# Patient Record
Sex: Female | Born: 1965 | Race: White | Hispanic: No | Marital: Married | Smoking: Never smoker
Health system: Southern US, Community
[De-identification: ages and names within clinical notes are randomized; demographics above are authoritative.]

## PROBLEM LIST (undated history)

## (undated) DIAGNOSIS — L0390 Cellulitis, unspecified: Secondary | ICD-10-CM

## (undated) DIAGNOSIS — S139XXA Sprain of joints and ligaments of unspecified parts of neck, initial encounter: Secondary | ICD-10-CM

## (undated) DIAGNOSIS — F411 Generalized anxiety disorder: Secondary | ICD-10-CM

## (undated) DIAGNOSIS — R51 Headache: Secondary | ICD-10-CM

## (undated) DIAGNOSIS — J069 Acute upper respiratory infection, unspecified: Secondary | ICD-10-CM

## (undated) DIAGNOSIS — N135 Crossing vessel and stricture of ureter without hydronephrosis: Secondary | ICD-10-CM

## (undated) DIAGNOSIS — R519 Headache, unspecified: Secondary | ICD-10-CM

## (undated) DIAGNOSIS — E669 Obesity, unspecified: Secondary | ICD-10-CM

## (undated) DIAGNOSIS — M25579 Pain in unspecified ankle and joints of unspecified foot: Secondary | ICD-10-CM

## (undated) DIAGNOSIS — I10 Essential (primary) hypertension: Secondary | ICD-10-CM

## (undated) DIAGNOSIS — E785 Hyperlipidemia, unspecified: Secondary | ICD-10-CM

## (undated) DIAGNOSIS — R062 Wheezing: Secondary | ICD-10-CM

## (undated) DIAGNOSIS — N951 Menopausal and female climacteric states: Secondary | ICD-10-CM

## (undated) DIAGNOSIS — F419 Anxiety disorder, unspecified: Secondary | ICD-10-CM

## (undated) DIAGNOSIS — F410 Panic disorder [episodic paroxysmal anxiety] without agoraphobia: Secondary | ICD-10-CM

## (undated) DIAGNOSIS — H35019 Changes in retinal vascular appearance, unspecified eye: Secondary | ICD-10-CM

## (undated) HISTORY — DX: Pain in unspecified ankle and joints of unspecified foot: M25.579

## (undated) HISTORY — DX: Crossing vessel and stricture of ureter without hydronephrosis: N13.5

## (undated) HISTORY — DX: Hyperlipidemia, unspecified: E78.5

## (undated) HISTORY — PX: REDUCTION MAMMAPLASTY: SUR839

## (undated) HISTORY — DX: Obesity, unspecified: E66.9

## (undated) HISTORY — PX: TONSILLECTOMY: SUR1361

## (undated) HISTORY — DX: Generalized anxiety disorder: F41.1

## (undated) HISTORY — DX: Anxiety disorder, unspecified: F41.9

## (undated) HISTORY — DX: Changes in retinal vascular appearance, unspecified eye: H35.019

## (undated) HISTORY — DX: Essential (primary) hypertension: I10

## (undated) HISTORY — DX: Cellulitis, unspecified: L03.90

## (undated) HISTORY — DX: Wheezing: R06.2

## (undated) HISTORY — DX: Headache, unspecified: R51.9

## (undated) HISTORY — DX: Menopausal and female climacteric states: N95.1

## (undated) HISTORY — DX: Sprain of joints and ligaments of unspecified parts of neck, initial encounter: S13.9XXA

## (undated) HISTORY — PX: BREAST ENHANCEMENT SURGERY: SHX7

## (undated) HISTORY — DX: Acute upper respiratory infection, unspecified: J06.9

## (undated) HISTORY — PX: ANKLE SURGERY: SHX546

## (undated) HISTORY — DX: Panic disorder (episodic paroxysmal anxiety): F41.0

## (undated) HISTORY — PX: ABDOMINAL HYSTERECTOMY: SHX81

## (undated) HISTORY — DX: Headache: R51

## (undated) HISTORY — PX: CHOLECYSTECTOMY: SHX55

---

## 2004-03-12 ENCOUNTER — Ambulatory Visit: Payer: Self-pay | Admitting: Unknown Physician Specialty

## 2005-01-17 ENCOUNTER — Emergency Department: Payer: Self-pay | Admitting: Emergency Medicine

## 2007-08-31 ENCOUNTER — Ambulatory Visit: Payer: Self-pay | Admitting: Family Medicine

## 2007-08-31 ENCOUNTER — Other Ambulatory Visit: Payer: Self-pay

## 2007-09-08 ENCOUNTER — Ambulatory Visit: Payer: Self-pay | Admitting: Internal Medicine

## 2008-12-12 ENCOUNTER — Ambulatory Visit: Payer: Self-pay | Admitting: Podiatry

## 2008-12-28 ENCOUNTER — Ambulatory Visit: Payer: Self-pay | Admitting: Podiatry

## 2009-05-25 ENCOUNTER — Emergency Department: Payer: Self-pay | Admitting: Unknown Physician Specialty

## 2009-05-31 ENCOUNTER — Ambulatory Visit: Payer: Self-pay | Admitting: Surgery

## 2010-04-07 ENCOUNTER — Ambulatory Visit: Payer: Self-pay | Admitting: Internal Medicine

## 2010-11-07 ENCOUNTER — Emergency Department: Payer: Self-pay | Admitting: Emergency Medicine

## 2011-01-14 ENCOUNTER — Ambulatory Visit: Payer: Self-pay | Admitting: Urology

## 2011-05-20 ENCOUNTER — Emergency Department: Payer: Self-pay | Admitting: Emergency Medicine

## 2013-02-09 ENCOUNTER — Emergency Department: Payer: Self-pay | Admitting: Emergency Medicine

## 2013-02-09 LAB — COMPREHENSIVE METABOLIC PANEL
Alkaline Phosphatase: 67 U/L
Anion Gap: 6 — ABNORMAL LOW (ref 7–16)
Bilirubin,Total: 0.9 mg/dL (ref 0.2–1.0)
Chloride: 99 mmol/L (ref 98–107)
Co2: 29 mmol/L (ref 21–32)
EGFR (African American): >60
EGFR (Non-African Amer.): >60
Glucose: 105 mg/dL — ABNORMAL HIGH (ref 65–99)
Potassium: 3 mmol/L — ABNORMAL LOW (ref 3.5–5.1)
SGPT (ALT): 45 U/L (ref 12–78)
Sodium: 134 mmol/L — ABNORMAL LOW (ref 136–145)
Total Protein: 8.2 g/dL (ref 6.4–8.2)

## 2013-02-09 LAB — URINALYSIS, COMPLETE
Bacteria: NONE SEEN
Bilirubin,UR: NEGATIVE
Blood: NEGATIVE
Leukocyte Esterase: NEGATIVE
Ph: 5 (ref 4.5–8.0)
Specific Gravity: 1.014 (ref 1.003–1.030)
Squamous Epithelial: 10
WBC UR: 2 /HPF (ref 0–5)

## 2013-02-09 LAB — LIPASE, BLOOD: Lipase: 86 U/L (ref 73–393)

## 2013-02-09 LAB — CBC
HCT: 39.5 % (ref 35.0–47.0)
HGB: 14.1 g/dL (ref 12.0–16.0)
MCH: 30.9 pg (ref 26.0–34.0)
MCV: 87 fL (ref 80–100)
RBC: 4.54 10*6/uL (ref 3.80–5.20)

## 2013-02-09 LAB — TROPONIN I: Troponin-I: <0.02 ng/mL

## 2013-02-17 ENCOUNTER — Encounter: Payer: Self-pay | Admitting: *Deleted

## 2013-02-20 ENCOUNTER — Encounter: Payer: Self-pay | Admitting: Cardiovascular Disease

## 2013-02-20 ENCOUNTER — Ambulatory Visit (INDEPENDENT_AMBULATORY_CARE_PROVIDER_SITE_OTHER): Payer: 59 | Admitting: Cardiovascular Disease

## 2013-02-20 ENCOUNTER — Encounter (INDEPENDENT_AMBULATORY_CARE_PROVIDER_SITE_OTHER): Payer: Self-pay

## 2013-02-20 VITALS — BP 134/100 | HR 94 | Ht 64.0 in | Wt 210.0 lb

## 2013-02-20 DIAGNOSIS — R079 Chest pain, unspecified: Secondary | ICD-10-CM

## 2013-02-20 DIAGNOSIS — I10 Essential (primary) hypertension: Secondary | ICD-10-CM

## 2013-02-20 DIAGNOSIS — R0789 Other chest pain: Secondary | ICD-10-CM | POA: Insufficient documentation

## 2013-02-20 MED ORDER — LISINOPRIL 10 MG PO TABS: 10.0000 mg | ORAL_TABLET | Freq: Every day | ORAL | Status: DC

## 2013-02-20 MED ORDER — POTASSIUM CHLORIDE ER 10 MEQ PO TBCR: 10.0000 meq | EXTENDED_RELEASE_TABLET | Freq: Every day | ORAL | Status: DC

## 2013-02-20 MED ORDER — HYDROCHLOROTHIAZIDE 25 MG PO TABS: 25.0000 mg | ORAL_TABLET | Freq: Every day | ORAL | Status: DC

## 2013-02-20 NOTE — Patient Instructions (Addendum)
  Your physician recommends that you return for lab work in: 3 weeks/BMP  Your physician recommends that you schedule a follow-up appointment in: 3 months/BMP  Your physician has recommended you make the following change in your medication:  Start K Dur Daily  Start lisinopril 10mg  Daily  Decrease HCTZ to 25mg  daily    [facility] Low Glycemic Diet (Source: [facility], 2006) Low Glycemic Foods (20-49) (Decrease risk of developing heart disease) Breakfast Cereals: All-Bran All-Bran Fruit 'n Oats Fiber One Oatmeal (not instant) Oat bran Fruits and fruit juices: (Limit to 1-2 servings per day) Apples Apricots (fresh & dried) Blackberries Blueberries Cherries Cranberries Peaches Pears Plums Prunes Grapefruit Raspberries Strawberries Tangerine Apple juice Grapefruit juice Tomato juice Beans and legumes (fresh-cooked): Black-eyed peas Butter beans Chick peas Lentils  Green beans Lima beans Kidney beans Navy beans Pinto beans Snow peas Non-starchy vegetables: Asparagus, avocado, broccoli, cabbage, cauliflower, celery, cucumber, greens, lettuce, mushrooms, peppers, tomatoes, okra, onions, spinach, summer squash Grains: Barley Bulgur Rye Wild rice Nuts and oils : Almonds Peanuts Sunflower seeds Hazelnuts Pecans Walnuts Oils that are liquid at room temperature Dairy, fish, meat, soy, and eggs: Milk, skim Lowfat cheese Yogurt, lowfat, fruit sugar sweetened Lean red meat Fish  Skinless chicken & [place] Shellfish Egg whites (up to 3 daily) Soy products  Egg yolks (up to 7 or _____ per week) Moderate Glycemic Foods (50-69) Breakfast Cereals: Bran Buds Bran Chex Just Right Mini-Wheats  Special K Swiss muesli Fruits: Banana (under-ripe) Dates Figs Grapes Kiwi Mango Oranges Raisins Fruit Juices: Cranberry juice Orange juice Beans and legumes: Boston-type baked beans Canned pinto, kidney, or navy beans Green peas Vegetables: Beets Carrots   Sweet potato Yam Corn on the cob Breads: Pita (pocket) bread Oat bran bread Pumpernickel bread Rye bread Wheat bread, high fiber  Grains: Cornmeal Rice, brown Rice, white Couscous Pasta: Macaroni Pizza, cheese Ravioli, meat filled Spaghetti, white  Nuts: Cashews Macadamia Snacks: Chocolate Ice cream, lowfat Muffin Popcorn High Glycemic Foods (70-100)  Breakfast Cereals: Cheerios Corn Chex Corn Flakes Cream of Wheat Grape Nuts Grape Nut Flakes Grits Nutri-Grain Puffed Rice Puffed Wheat Rice Chex Rice Krispies Shredded Wheat Team Total Fruits: Pineapple Watermelon Banana (over-ripe) Beverages: Sodas, sweet tea, pineapple juice Vegetables: Potato, baked, boiled, fried, mashed [place] fries Canned or frozen corn Parsnips Winter squash Breads: Most breads (white and whole grain) Bagels Bread sticks Bread stuffing Kaiser roll Dinner rolls Grains: Rice, instant Tapioca, with milk Candy and most cookies Snacks: Donuts Corn chips Jelly beans Pretzels Pastries    REDUCE HIGH SODIUM FOODS LIKE CANNED SOUP, GRAVY, SAUCES, READY PREPARED FOODS LIKE FROZEN FOODS; LEAN CUISINE, LASAGNA. BACON, SAUSAGE, LUNCH MEAT, FAST FOODS, HOT DOGS, CHIPS, PIZZA.

## 2013-02-20 NOTE — Assessment & Plan Note (Signed)
Her blood pressure is moderately elevated. She's on hydrochlorothiazide 50 mg a day. She admits to having some palpitations which I suspect are due to premature ventricular contractions.  Her potassium level recently when she was seen in the [facility] emergency room was 3.0.  We will reduce her HCTZ to 25 mg a day. We'll add potassium chloride 10 mg a day. We'll also add lisinopril 10 mg a day. We'll see her back in the office in 3 weeks for this about profile. We'll see her back in 3 months or followup office visit as well as basic metabolic profile.

## 2013-02-20 NOTE — Assessment & Plan Note (Signed)
Meagan Becker presents for further evaluation and management of her chest discomfort. Her episodes of chest discomfort sound very atypical and I do not think that she has obstructive coronary artery disease.  Her pains were more related to a GI etiology.  We will start her on her regular exercise program. If she is able to progress and increase her exercise regimen on a weekly basis without difficulties, then I do not think that she has any serious coronary artery disease. I think that she is somewhat deconditioned.  She clearly eats a lot of unhealthy foods. She admits to eating a lot of fast foods and prepared foods. I suspect that she may have some degree of gastroesophageal reflux. We spent some time going over a healthy diet. Given her the [facility] low glycemic index diet.

## 2013-02-20 NOTE — Progress Notes (Signed)
     Meagan Becker Date of Birth  1965/06/03       [facility]    [place] [address], [address]  [address], [address] [place], [place]  [address]   [place], [place]  [address] [phone]     [phone]   Fax  [phone]    Fax [phone]  Problem List: 1. Chest pain / abdominal pain 2. Palpitations 3. Hypertension 4. Anxiety   History of Present Illness:  Meagan Becker is a 47 yo with hx of anxiety and HTN.  She presented to [facility] with chest pain / abdominal pain / abdominal swelling.    She was treated with GI cocktail and improved.  She has had some palpitations since that time.  She does not exercise - she is able to do her normal activities without problems.     Current Outpatient Prescriptions on File Prior to Visit  Medication Sig Dispense Refill  . hydrochlorothiazide (HYDRODIURIL) 50 MG tablet Take 50 mg by mouth daily.       No current facility-administered medications on file prior to visit.    No Known Allergies  Past Medical History  Diagnosis Date  . Anxiety state   . Hypertension   . Cellulitis   . Anxiety disorder   . Headache   . Hyperlipidemia   . Pain, joint, ankle   . Obesity   . Panic disorder   . Retinal vascular changes   . Sprain of neck   . Stricture of ureter   . Menopausal symptom   . Upper respiratory infection   . Wheezing   . Cancer of uterus     Past Surgical History  Procedure Laterality Date  . Abdominal hysterectomy    . Tonsillectomy    . Breast enhancement surgery    . Cholecystectomy    . Ankle surgery      History  Smoking status  . Never Smoker   Smokeless tobacco  . Not on file    History  Alcohol Use  . Yes    Comment: social    Family History  Problem Relation Age of Onset  . Hypercholesterolemia Mother   . Hypertension Mother   . Heart disease Father   . Hypertension Father   . Cancer Father     Reviw of Systems:  Reviewed in the HPI.  All other systems are  negative.  Physical Exam: Blood pressure 134/100, pulse 94, height 5\' 4"  (1.626 m), weight 210 lb (95.255 kg). General: Well developed, well nourished, in no acute distress.  Head: Normocephalic, atraumatic, sclera non-icteric, mucus membranes are moist,   Neck: Supple. Carotids are 2 + without bruits. No JVD   Lungs: Clear   Heart: RR, normal S1, S2  Abdomen: Soft, non-tender, non-distended with normal bowel sounds.   Msk:  Strength and tone are normal   Extremities: No clubbing or cyanosis. No edema.  Distal pedal pulses are 2+ and equal    Neuro: CN II - XII intact.  Alert and oriented X 3.   Psych:  Normal   ECG: Dec. 8 , 2014   NSR , no St or T wave changes , rsR'  Assessment / Plan:

## 2013-03-13 ENCOUNTER — Other Ambulatory Visit: Payer: 59

## 2013-03-15 ENCOUNTER — Ambulatory Visit: Payer: Self-pay | Admitting: Physician Assistant

## 2013-03-20 ENCOUNTER — Other Ambulatory Visit: Payer: 59

## 2013-05-15 ENCOUNTER — Other Ambulatory Visit: Payer: 59

## 2013-05-15 ENCOUNTER — Ambulatory Visit: Payer: 59 | Admitting: Cardiovascular Disease

## 2013-05-15 ENCOUNTER — Encounter: Payer: Self-pay | Admitting: *Deleted

## 2014-02-12 ENCOUNTER — Other Ambulatory Visit: Payer: Self-pay

## 2014-02-12 DIAGNOSIS — R079 Chest pain, unspecified: Secondary | ICD-10-CM

## 2014-02-12 NOTE — Telephone Encounter (Signed)
Refill HCTZ

## 2014-05-16 ENCOUNTER — Ambulatory Visit: Payer: Self-pay | Admitting: Family Medicine

## 2014-05-23 ENCOUNTER — Telehealth: Payer: Self-pay | Admitting: Cardiovascular Disease

## 2014-05-23 NOTE — Telephone Encounter (Signed)
[facility] called to obtain records for a new patient appt. Patient was a no show.  Office will call back later for records if patient reschedules and signs a release form.

## 2014-07-30 ENCOUNTER — Ambulatory Visit
Admission: RE | Admit: 2014-07-30 | Discharge: 2014-07-30 | Disposition: A | Discharge status: Home / Self Care | Payer: Managed Care, Other (non HMO) | Source: Ambulatory Visit | Attending: Family Medicine | Admitting: Family Medicine

## 2014-07-30 ENCOUNTER — Other Ambulatory Visit: Payer: Self-pay | Admitting: Family Medicine

## 2014-07-30 DIAGNOSIS — M792 Neuralgia and neuritis, unspecified: Secondary | ICD-10-CM | POA: Diagnosis present

## 2014-11-08 ENCOUNTER — Emergency Department: Payer: Managed Care, Other (non HMO)

## 2014-11-08 ENCOUNTER — Encounter: Payer: Self-pay | Admitting: Emergency Medicine

## 2014-11-08 ENCOUNTER — Emergency Department
Admission: EM | Admit: 2014-11-08 | Discharge: 2014-11-08 | Disposition: A | Discharge status: Home / Self Care | Payer: Managed Care, Other (non HMO) | Attending: Emergency Medicine | Admitting: Emergency Medicine

## 2014-11-08 DIAGNOSIS — M25511 Pain in right shoulder: Secondary | ICD-10-CM | POA: Diagnosis present

## 2014-11-08 DIAGNOSIS — I10 Essential (primary) hypertension: Secondary | ICD-10-CM | POA: Insufficient documentation

## 2014-11-08 DIAGNOSIS — Z79899 Other long term (current) drug therapy: Secondary | ICD-10-CM | POA: Insufficient documentation

## 2014-11-08 MED ORDER — PREDNISONE 10 MG PO TABS: ORAL_TABLET | ORAL | Status: DC

## 2014-11-08 NOTE — ED Notes (Signed)
Pt states her right shoulder began hurting on Monday, denies injury to the area, pt states her neck/shoulder area are stiff.

## 2014-11-08 NOTE — ED Provider Notes (Signed)
[facility] Emergency Department Provider Note  ____________________________________________  Time seen: Approximately 8:35 AM  I have reviewed the triage vital signs and the nursing notes.   HISTORY  Chief Complaint Shoulder Pain   HPI Meagan Becker is a 49 y.o. female , complains of right shoulder pain4 days. Patient states that there is a burning sensation in her right upper arm. She states also makes her shoulder and neck muscle stiff. She saw her doctor this week who prescribed Klonopin, Robaxin, and Norco which is not helping. Patient denies any history of injury that she is aware of. She states that the only thing that she lifts is  a baby. Currently her pain is 9 out of 10. Any range of motion increases her pain and nothing is helping her pain. She also states that yesterday she began throwing up after beginning her medication. She has taken Norco in the past and not had any problems. She is unsure whether this was the Robaxin or the Klonopin that was doing this.   Past Medical History  Diagnosis Date  . Anxiety state   . Hypertension   . Cellulitis   . Anxiety disorder   . Headache   . Hyperlipidemia   . Pain, joint, ankle   . Obesity   . Panic disorder   . Retinal vascular changes   . Sprain of neck   . Stricture of ureter   . Menopausal symptom   . Upper respiratory infection   . Wheezing   . Cancer of uterus     Patient Active Problem List   Diagnosis Date Noted  . Essential hypertension 02/20/2013  . Chest discomfort 02/20/2013    Past Surgical History  Procedure Laterality Date  . Abdominal hysterectomy    . Tonsillectomy    . Breast enhancement surgery    . Cholecystectomy    . Ankle surgery      Current Outpatient Rx  Name  Route  Sig  Dispense  Refill  . diazepam (VALIUM) 5 MG tablet   Oral   Take 5 mg by mouth as needed.          . hydrochlorothiazide (HYDRODIURIL) 25 MG tablet   Oral   Take 1 tablet (25 mg  total) by mouth daily.   90 tablet   3   . lisinopril (PRINIVIL,ZESTRIL) 10 MG tablet   Oral   Take 1 tablet (10 mg total) by mouth daily.   90 tablet   3   . potassium chloride (K-DUR) 10 MEQ tablet   Oral   Take 1 tablet (10 mEq total) by mouth daily.   90 tablet   3   . predniSONE (DELTASONE) 10 MG tablet      Take 6 tablets  today, on day 2 take 5 tablets, day 3 take 4 tablets, day 4 take 3 tablets, day 5 take  2 tablets and 1 tablet the last day   21 tablet   0     Allergies Review of patient's allergies indicates no known allergies.  Family History  Problem Relation Age of Onset  . Hypercholesterolemia Mother   . Hypertension Mother   . Heart disease Father   . Hypertension Father   . Cancer Father     Social History Social History  Substance Use Topics  . Smoking status: Never Smoker   . Smokeless tobacco: None  . Alcohol Use: Yes     Comment: social    Review of Systems Constitutional:  No fever/chills Cardiovascular: Denies chest pain. Respiratory: Denies shortness of breath. Genitourinary: Negative for dysuria. Musculoskeletal: Negative for back pain. Positive for right arm pain Skin: Negative for rash. Neurological: Negative for headaches, focal weakness or numbness.  10-point ROS otherwise negative.  ____________________________________________   PHYSICAL EXAM:  VITAL SIGNS: ED Triage Vitals  Enc Vitals Group     BP 11/08/14 0819 119/73 mmHg     Pulse Rate 11/08/14 0819 89     Resp 11/08/14 0819 18     Temp 11/08/14 0819 98.8 F (37.1 C)     Temp Source 11/08/14 0819 Oral     SpO2 11/08/14 0819 100 %     Weight 11/08/14 0814 200 lb (90.719 kg)     Height 11/08/14 0814 5\' 4"  (1.626 m)     Head Cir --      Peak Flow --      Pain Score 11/08/14 0814 9     Pain Loc --      Pain Edu? --      Excl. in [place]? --     Constitutional: Alert and oriented. Well appearing and in no acute distress. Eyes: Conjunctivae are normal. PERRL.  EOMI. Head: Atraumatic. Nose: No congestion/rhinnorhea. Neck: No stridor.  Supple. No restriction in range of motion. There is some right trapezius muscle tenderness. No gross deformity. Cardiovascular: Normal rate, regular rhythm. Grossly normal heart sounds.  Good peripheral circulation. Respiratory: Normal respiratory effort.  No retractions. Lungs CTAB. Gastrointestinal: No distention. Musculoskeletal: No lower extremity tenderness nor edema.  No joint effusions. Neurologic:  Normal speech and language. No gross focal neurologic deficits are appreciated. No gait instability. Skin:  Skin is warm, dry and intact. No rash noted. Psychiatric: Mood and affect are normal. Speech and behavior are normal.  ____________________________________________   LABS (all labs ordered are listed, but only abnormal results are displayed)  Labs Reviewed - No data to display   RADIOLOGY  Right shoulder per radiologist normal I, Johnn Hai, personally viewed and evaluated these images (plain radiographs) as part of my medical decision making.  ____________________________________________   PROCEDURES  Procedure(s) performed: None  Critical Care performed: No  ____________________________________________   INITIAL IMPRESSION / ASSESSMENT AND PLAN / ED COURSE  Pertinent labs & imaging results that were available during my care of the patient were reviewed by me and considered in my medical decision making (see chart for details).  Patient was placed in a sling for comfort. She is going to discontinue the Robaxin as she thinks this is what is causing her nausea. She is given a prescription for prednisone 10 mg 6 day taper. She was reassured that the bone of her shoulder was normal. We discussed an MRI either through her PCP or orthopedist. Most likely this is a rotator cuff injury. Patient will use ice and stay out of heat to see if this gives her more relief also.    FINAL CLINICAL  IMPRESSION(S) / ED DIAGNOSES  Final diagnoses:  Acute shoulder pain, right      Johnn Hai, PA-C 11/08/14 1350  Lavonia Drafts, MD 11/08/14 [phone]

## 2014-11-20 ENCOUNTER — Other Ambulatory Visit: Payer: Self-pay | Admitting: Orthopedic Surgery

## 2014-11-20 DIAGNOSIS — M5412 Radiculopathy, cervical region: Secondary | ICD-10-CM

## 2014-11-28 ENCOUNTER — Ambulatory Visit
Admission: RE | Admit: 2014-11-28 | Discharge: 2014-11-28 | Disposition: A | Discharge status: Home / Self Care | Payer: Managed Care, Other (non HMO) | Source: Ambulatory Visit | Attending: Orthopedic Surgery | Admitting: Orthopedic Surgery

## 2014-11-28 DIAGNOSIS — M4722 Other spondylosis with radiculopathy, cervical region: Secondary | ICD-10-CM | POA: Insufficient documentation

## 2014-11-28 DIAGNOSIS — M5412 Radiculopathy, cervical region: Secondary | ICD-10-CM

## 2014-11-28 DIAGNOSIS — E041 Nontoxic single thyroid nodule: Secondary | ICD-10-CM | POA: Diagnosis not present

## 2014-11-30 ENCOUNTER — Other Ambulatory Visit: Payer: Self-pay | Admitting: Orthopedic Surgery

## 2014-11-30 DIAGNOSIS — G9589 Other specified diseases of spinal cord: Secondary | ICD-10-CM

## 2014-11-30 DIAGNOSIS — M5412 Radiculopathy, cervical region: Secondary | ICD-10-CM

## 2014-12-04 ENCOUNTER — Ambulatory Visit: Payer: Managed Care, Other (non HMO)

## 2014-12-07 ENCOUNTER — Ambulatory Visit
Admission: RE | Admit: 2014-12-07 | Discharge: 2014-12-07 | Disposition: A | Discharge status: Home / Self Care | Payer: Managed Care, Other (non HMO) | Source: Ambulatory Visit | Attending: Orthopedic Surgery | Admitting: Orthopedic Surgery

## 2014-12-07 DIAGNOSIS — G959 Disease of spinal cord, unspecified: Secondary | ICD-10-CM | POA: Diagnosis not present

## 2014-12-07 DIAGNOSIS — M5412 Radiculopathy, cervical region: Secondary | ICD-10-CM

## 2014-12-07 DIAGNOSIS — G9589 Other specified diseases of spinal cord: Secondary | ICD-10-CM

## 2014-12-07 LAB — POCT I-STAT CREATININE: CREATININE: 0.9 mg/dL (ref 0.44–1.00)

## 2014-12-07 MED ORDER — GADOBENATE DIMEGLUMINE 529 MG/ML IV SOLN
20.0000 mL | Freq: Once | INTRAVENOUS | Status: AC | PRN
  Administered 2014-12-07: 20 mL via INTRAVENOUS

## 2015-02-04 ENCOUNTER — Other Ambulatory Visit: Payer: Self-pay | Admitting: Otolaryngology

## 2015-02-04 DIAGNOSIS — E041 Nontoxic single thyroid nodule: Secondary | ICD-10-CM

## 2015-02-12 ENCOUNTER — Ambulatory Visit
Admission: RE | Admit: 2015-02-12 | Discharge: 2015-02-12 | Disposition: A | Discharge status: Home / Self Care | Payer: Managed Care, Other (non HMO) | Source: Ambulatory Visit | Attending: Otolaryngology | Admitting: Otolaryngology

## 2015-02-12 DIAGNOSIS — E041 Nontoxic single thyroid nodule: Secondary | ICD-10-CM | POA: Diagnosis present

## 2015-02-12 NOTE — Procedures (Signed)
Procedure and risks discussed with patient. Informed consent obtained. Under US guidance, FNA of left thyroid nodule was performed.No immediate complications.

## 2015-02-13 LAB — CYTOLOGY - NON PAP

## 2015-03-26 ENCOUNTER — Other Ambulatory Visit: Payer: Self-pay | Admitting: Neurosurgery

## 2015-03-26 DIAGNOSIS — D497 Neoplasm of unspecified behavior of endocrine glands and other parts of nervous system: Secondary | ICD-10-CM

## 2015-03-26 DIAGNOSIS — G35 Multiple sclerosis: Secondary | ICD-10-CM

## 2015-04-04 ENCOUNTER — Other Ambulatory Visit: Payer: Managed Care, Other (non HMO)

## 2015-04-04 ENCOUNTER — Inpatient Hospital Stay: Admission: RE | Admit: 2015-04-04 | Payer: Managed Care, Other (non HMO) | Source: Ambulatory Visit

## 2015-10-13 ENCOUNTER — Emergency Department
Admission: EM | Admit: 2015-10-13 | Discharge: 2015-10-13 | Disposition: A | Discharge status: Home / Self Care | Payer: 59 | Attending: Emergency Medicine | Admitting: Emergency Medicine

## 2015-10-13 ENCOUNTER — Emergency Department: Payer: 59

## 2015-10-13 ENCOUNTER — Encounter: Payer: Self-pay | Admitting: Emergency Medicine

## 2015-10-13 DIAGNOSIS — Y929 Unspecified place or not applicable: Secondary | ICD-10-CM | POA: Diagnosis not present

## 2015-10-13 DIAGNOSIS — X501XXA Overexertion from prolonged static or awkward postures, initial encounter: Secondary | ICD-10-CM | POA: Diagnosis not present

## 2015-10-13 DIAGNOSIS — Z8541 Personal history of malignant neoplasm of cervix uteri: Secondary | ICD-10-CM | POA: Diagnosis not present

## 2015-10-13 DIAGNOSIS — W19XXXA Unspecified fall, initial encounter: Secondary | ICD-10-CM

## 2015-10-13 DIAGNOSIS — E785 Hyperlipidemia, unspecified: Secondary | ICD-10-CM | POA: Insufficient documentation

## 2015-10-13 DIAGNOSIS — S80211A Abrasion, right knee, initial encounter: Secondary | ICD-10-CM

## 2015-10-13 DIAGNOSIS — S8391XA Sprain of unspecified site of right knee, initial encounter: Secondary | ICD-10-CM | POA: Insufficient documentation

## 2015-10-13 DIAGNOSIS — I10 Essential (primary) hypertension: Secondary | ICD-10-CM | POA: Diagnosis not present

## 2015-10-13 DIAGNOSIS — Y999 Unspecified external cause status: Secondary | ICD-10-CM | POA: Insufficient documentation

## 2015-10-13 DIAGNOSIS — Y939 Activity, unspecified: Secondary | ICD-10-CM | POA: Diagnosis not present

## 2015-10-13 DIAGNOSIS — M25561 Pain in right knee: Secondary | ICD-10-CM | POA: Diagnosis present

## 2015-10-13 MED ORDER — NAPROXEN 500 MG PO TABS
500.0000 mg | ORAL_TABLET | Freq: Two times a day (BID) | ORAL | 0 refills | Status: DC
Start: 2015-10-13 — End: 2015-12-22

## 2015-10-13 NOTE — ED Provider Notes (Signed)
[facility] Emergency Department Provider Note  ____________________________________________  Time seen: Approximately 2:57 PM  I have reviewed the triage vital signs and the nursing notes.   HISTORY  Chief Complaint Knee Pain    HPI Meagan Becker is a 50 y.o. female , NAD, presents to the emergency department for evaluation of right lower extremity pain after a fall. Patient states she has chronic weakness of the left lower extremity due to previous injuries and surgery. States she felt the left lower extremity "giving out" and tried to brace her self with a right lower extremity causing her to twist at the knee and fall. States she has had pain from the right hip down through the right ankle since the injury. Fell onto her right side including her upper extremity but has no pain about the right upper extremity. Notes swelling in the right knee but no redness or warmth. Denies any head injury, visual changes, LOC, saddle paresthesias nor loss of bowel or bladder control. Has been icing the right knee which has given some relief. Has not taken anything over-the-counter.   Past Medical History:  Diagnosis Date  . Anxiety disorder   . Anxiety state   . Cancer of uterus ([place])   . Cellulitis   . Headache   . Hyperlipidemia   . Hypertension   . Menopausal symptom   . Obesity   . Pain, joint, ankle   . Panic disorder   . Retinal vascular changes   . Sprain of neck   . Stricture of ureter   . Upper respiratory infection   . Wheezing     Patient Active Problem List   Diagnosis Date Noted  . Essential hypertension 02/20/2013  . Chest discomfort 02/20/2013    Past Surgical History:  Procedure Laterality Date  . ABDOMINAL HYSTERECTOMY    . ANKLE SURGERY    . BREAST ENHANCEMENT SURGERY    . CHOLECYSTECTOMY    . TONSILLECTOMY      Prior to Admission medications   Medication Sig Start Date End Date Taking? Authorizing Provider  diazepam (VALIUM) 5  MG tablet Take 5 mg by mouth as needed.  02/18/13   Historical Provider, MD  hydrochlorothiazide (HYDRODIURIL) 25 MG tablet Take 1 tablet (25 mg total) by mouth daily. 02/20/13   Thayer Headings, MD  lisinopril (PRINIVIL,ZESTRIL) 10 MG tablet Take 1 tablet (10 mg total) by mouth daily. 02/20/13   Thayer Headings, MD  naproxen (NAPROSYN) 500 MG tablet Take 1 tablet (500 mg total) by mouth 2 (two) times daily with a meal. 10/13/15   Jami L Hagler, PA-C  potassium chloride (K-DUR) 10 MEQ tablet Take 1 tablet (10 mEq total) by mouth daily. 02/20/13   Thayer Headings, MD  predniSONE (DELTASONE) 10 MG tablet Take 6 tablets  today, on day 2 take 5 tablets, day 3 take 4 tablets, day 4 take 3 tablets, day 5 take  2 tablets and 1 tablet the last day 11/08/14   Johnn Hai, PA-C    Allergies Review of patient's allergies indicates no known allergies.  Family History  Problem Relation Age of Onset  . Hypercholesterolemia Mother   . Hypertension Mother   . Heart disease Father   . Hypertension Father   . Cancer Father     Social History Social History  Substance Use Topics  . Smoking status: Never Smoker  . Smokeless tobacco: Never Used  . Alcohol use Yes     Comment: social  Review of Systems  Constitutional: No fever/chills, fatigue Eyes: No visual changes. Cardiovascular: No chest pain. Respiratory:  No shortness of breath. No wheezing.  Gastrointestinal: No abdominal pain.  No nausea, vomiting.   Musculoskeletal: Positive pain diffusely about the right lower extremity starting at the hip and ending at the ankle. Negative for back, neck pain.  Skin: Positive abrasion right knee. Negative for rash, redness, swelling, bruising, open wounds or lacerations. Neurological: Negative for headaches, focal weakness or numbness. No tingling. 10-point ROS otherwise negative.  ____________________________________________   PHYSICAL EXAM:  VITAL SIGNS: ED Triage Vitals  Enc Vitals Group      BP 10/13/15 1406 (!) 145/85     Pulse Rate 10/13/15 1406 (!) 103     Resp 10/13/15 1406 18     Temp 10/13/15 1406 98.4 F (36.9 C)     Temp Source 10/13/15 1406 Oral     SpO2 10/13/15 1406 97 %     Weight 10/13/15 1406 210 lb (95.3 kg)     Height 10/13/15 1406 5\' 4"  (1.626 m)     Head Circumference --      Peak Flow --      Pain Score 10/13/15 1401 7     Pain Loc --      Pain Edu? --      Excl. in [place]? --      Constitutional: Alert and oriented. Well appearing and in no acute distress. Eyes: Conjunctivae are normal.  Head: Atraumatic. Cardiovascular: Good peripheral circulation with 2+ pulses noted in the right lower extremity. Capillary refill is brisk in all digits of the right foot. Respiratory: Normal respiratory effort without tachypnea or retractions.  Musculoskeletal: Full range of motion of the right hip, knee, ankle and foot but with pain with full extension of the right knee and flexion of the right hip. No laxity with anterior or posterior drawer of the right knee. No laxity with varus or valgus stress of the left knee. Negative patellofemoral grind of the right knee. No tenderness to palpation about the femur nor right lower extremity. No laxity with anterior or posterior drawer of the right ankle. No laxity with varus or valgus stress of the right ankle. Full range of motion of the right ankle without crepitus or bony abnormalities noted. No lower extremity tenderness nor edema.  No joint effusions. Neurologic:  Normal speech and language. No gross focal neurologic deficits are appreciated.  Skin:  Superficial, nonbleeding abrasion noted to the anterior portion of the right knee. Skin is warm, dry. No rash, redness, bruising, skin sores, open wounds or lacerations are noted. Psychiatric: Mood and affect are normal. Speech and behavior are normal. Patient exhibits appropriate insight and judgement.   ____________________________________________    LABS  None ____________________________________________  EKG  None ____________________________________________  RADIOLOGY I have personally viewed and evaluated these images (plain radiographs) as part of my medical decision making, as well as reviewing the written report by the radiologist.  Dg Knee Complete 4 Views Right  Result Date: 10/13/2015 CLINICAL DATA:  Right knee pain status post fall. EXAM: RIGHT KNEE - COMPLETE 4+ VIEW COMPARISON:  None. FINDINGS: No evidence of fracture, dislocation, or joint effusion. No evidence of arthropathy or other focal bone abnormality. Soft tissues are unremarkable. IMPRESSION: Negative. Electronically Signed   By: Fidela Salisbury M.D.   On: 10/13/2015 14:39  Dg Hip Unilat With Pelvis 2-3 Views Right  Result Date: 10/13/2015 CLINICAL DATA:  Fall, right groin pain. EXAM: DG HIP (WITH  OR WITHOUT PELVIS) 2-3V RIGHT COMPARISON:  None FINDINGS: Hip joints and SI joints are symmetric and unremarkable. No acute bony abnormality. Specifically, no fracture, subluxation, or dislocation. Soft tissues are intact. IMPRESSION: No acute bony abnormality. Electronically Signed   By: Rolm Baptise M.D.   On: 10/13/2015 15:26   ____________________________________________    PROCEDURES  Procedure(s) performed: None   Procedures   Medications - No data to display   ____________________________________________   INITIAL IMPRESSION / ASSESSMENT AND PLAN / ED COURSE  Pertinent labs & imaging results that were available during my care of the patient were reviewed by me and considered in my medical decision making (see chart for details).  Clinical Course    Patient's diagnosis is consistent with Right knee sprain, abrasion of right knee due to fall. Patient will be discharged home with prescriptions for naproxen to take as directed. Patient may apply ice to the affected areas 20 minutes 3-4 times daily as needed. Discussed by range of motion and  stretching to decrease pain and improve mobility. Patient is to follow up with her primary care provider if symptoms persist past this treatment course. Patient is given ED precautions to return to the ED for any worsening or new symptoms.      ____________________________________________  FINAL CLINICAL IMPRESSION(S) / ED DIAGNOSES  Final diagnoses:  Right knee sprain, initial encounter  Abrasion of right knee, initial encounter  Fall, initial encounter      NEW MEDICATIONS STARTED DURING THIS VISIT:  Discharge Medication List as of 10/13/2015  3:43 PM    START taking these medications   Details  naproxen (NAPROSYN) 500 MG tablet Take 1 tablet (500 mg total) by mouth 2 (two) times daily with a meal., Starting Sun 10/13/2015, [place], PA-C 10/13/15 1625    Harvest Dark, MD 10/14/15 1511

## 2015-10-13 NOTE — ED Notes (Signed)
States she twisted left ankle  Then twisted the right and fell onto right side   Having some pain from right hip area to ankle  states pain is mainly to right knee   No deformity noted

## 2015-10-13 NOTE — ED Triage Notes (Signed)
Pt states fell approx 2 hrs ago. Pt c/o R leg pain, specifically R knee at this time. Denies LOC, denies hitting her head.

## 2015-12-22 ENCOUNTER — Encounter: Payer: Self-pay | Admitting: Emergency Medicine

## 2015-12-22 ENCOUNTER — Emergency Department
Admission: EM | Admit: 2015-12-22 | Discharge: 2015-12-22 | Disposition: A | Discharge status: Home / Self Care | Payer: Commercial Managed Care - HMO | Attending: Emergency Medicine | Admitting: Emergency Medicine

## 2015-12-22 ENCOUNTER — Other Ambulatory Visit: Payer: Self-pay

## 2015-12-22 ENCOUNTER — Emergency Department: Payer: Commercial Managed Care - HMO

## 2015-12-22 DIAGNOSIS — I10 Essential (primary) hypertension: Secondary | ICD-10-CM | POA: Insufficient documentation

## 2015-12-22 DIAGNOSIS — N3001 Acute cystitis with hematuria: Secondary | ICD-10-CM | POA: Diagnosis not present

## 2015-12-22 DIAGNOSIS — Z8542 Personal history of malignant neoplasm of other parts of uterus: Secondary | ICD-10-CM | POA: Insufficient documentation

## 2015-12-22 DIAGNOSIS — R1012 Left upper quadrant pain: Secondary | ICD-10-CM | POA: Diagnosis present

## 2015-12-22 DIAGNOSIS — Z79899 Other long term (current) drug therapy: Secondary | ICD-10-CM | POA: Insufficient documentation

## 2015-12-22 DIAGNOSIS — R1084 Generalized abdominal pain: Secondary | ICD-10-CM

## 2015-12-22 LAB — CBC
HCT: 36.6 % (ref 35.0–47.0)
Hemoglobin: 12.8 g/dL (ref 12.0–16.0)
MCH: 29.9 pg (ref 26.0–34.0)
MCHC: 34.9 g/dL (ref 32.0–36.0)
MCV: 85.5 fL (ref 80.0–100.0)
PLATELETS: 349 10*3/uL (ref 150–440)
RBC: 4.28 MIL/uL (ref 3.80–5.20)
RDW: 12.3 % (ref 11.5–14.5)
WBC: 6.4 10*3/uL (ref 3.6–11.0)

## 2015-12-22 LAB — URINALYSIS COMPLETE WITH MICROSCOPIC (ARMC ONLY)
BILIRUBIN URINE: NEGATIVE
Glucose, UA: NEGATIVE mg/dL
HGB URINE DIPSTICK: NEGATIVE
KETONES UR: NEGATIVE mg/dL
Nitrite: NEGATIVE
PH: 5 (ref 5.0–8.0)
Protein, ur: NEGATIVE mg/dL
Specific Gravity, Urine: 1.019 (ref 1.005–1.030)

## 2015-12-22 LAB — COMPREHENSIVE METABOLIC PANEL
ALT: 29 U/L (ref 14–54)
AST: 32 U/L (ref 15–41)
Albumin: 4 g/dL (ref 3.5–5.0)
Alkaline Phosphatase: 43 U/L (ref 38–126)
Anion gap: 7 (ref 5–15)
BUN: 14 mg/dL (ref 6–20)
CHLORIDE: 102 mmol/L (ref 101–111)
CO2: 27 mmol/L (ref 22–32)
CREATININE: 0.78 mg/dL (ref 0.44–1.00)
Calcium: 9.1 mg/dL (ref 8.9–10.3)
Glucose, Bld: 103 mg/dL — ABNORMAL HIGH (ref 65–99)
POTASSIUM: 3.6 mmol/L (ref 3.5–5.1)
Sodium: 136 mmol/L (ref 135–145)
Total Bilirubin: 1.1 mg/dL (ref 0.3–1.2)
Total Protein: 7.6 g/dL (ref 6.5–8.1)

## 2015-12-22 LAB — PREGNANCY, URINE: Preg Test, Ur: NEGATIVE

## 2015-12-22 LAB — LIPASE, BLOOD: LIPASE: 21 U/L (ref 11–51)

## 2015-12-22 LAB — TROPONIN I

## 2015-12-22 MED ORDER — MORPHINE SULFATE (PF) 4 MG/ML IV SOLN
INTRAVENOUS | Status: AC
  Administered 2015-12-22: 4 mg via INTRAVENOUS
  Filled 2015-12-22: qty 1

## 2015-12-22 MED ORDER — CEPHALEXIN 500 MG PO CAPS: 500.0000 mg | ORAL_CAPSULE | Freq: Four times a day (QID) | ORAL | 0 refills | Status: AC

## 2015-12-22 MED ORDER — ONDANSETRON HCL 4 MG/2ML IJ SOLN
4.0000 mg | Freq: Once | INTRAMUSCULAR | Status: AC
  Administered 2015-12-22: 4 mg via INTRAVENOUS

## 2015-12-22 MED ORDER — ONDANSETRON HCL 4 MG/2ML IJ SOLN
INTRAMUSCULAR | Status: AC
  Administered 2015-12-22: 4 mg via INTRAVENOUS
  Filled 2015-12-22: qty 2

## 2015-12-22 MED ORDER — IOPAMIDOL (ISOVUE-300) INJECTION 61%
30.0000 mL | Freq: Once | INTRAVENOUS | Status: AC
  Administered 2015-12-22: 30 mL via ORAL

## 2015-12-22 MED ORDER — IOPAMIDOL (ISOVUE-300) INJECTION 61%
100.0000 mL | Freq: Once | INTRAVENOUS | Status: AC | PRN
  Administered 2015-12-22: 100 mL via INTRAVENOUS

## 2015-12-22 MED ORDER — MORPHINE SULFATE (PF) 4 MG/ML IV SOLN
4.0000 mg | Freq: Once | INTRAVENOUS | Status: AC
  Administered 2015-12-22: 4 mg via INTRAVENOUS

## 2015-12-22 NOTE — ED Notes (Signed)
Report received. Pt is drinking contrast for ct. Pt previously medicated with morphine and zofran.

## 2015-12-22 NOTE — Discharge Instructions (Signed)
Follow up with Dr Rochel Brome and your regular family doctor. Return for worse pain , fever or vomiting or feeling sicker. Take keflex 1 pill 4 times a day for the UTI.

## 2015-12-22 NOTE — ED Notes (Addendum)
ekg added on

## 2015-12-22 NOTE — ED Triage Notes (Signed)
Pt presents to ED with reports of LUQ abdominal pain, nausea and urinary frequency.

## 2015-12-22 NOTE — ED Provider Notes (Signed)
[facility] Emergency Department Provider Note   ____________________________________________   First MD Initiated Contact with Patient 12/22/15 0900     (approximate)  I have reviewed the triage vital signs and the nursing notes.   HISTORY  Chief Complaint Abdominal Pain and Nausea    HPI Meagan Becker is a 50 y.o. female she reports she's having abdominal pain on the left side. She's had this before a few times but never quite this bad. Her doctor has done a kidney ultrasound and x-rays and not found anything. she reports it starts out feeling like a lump under her left breast and then moves down into her abdomen and the lump seems to move down into the abdomen notes down even down to the left lower quadrant. She 7. nauseated because of the pain. The pain is moderately severe. Started this morning earlier. The achy pain. It's worse with breathing and movement.   Past Medical History:  Diagnosis Date  . Anxiety disorder   . Anxiety state   . Cancer of uterus ([place])   . Cellulitis   . Headache   . Hyperlipidemia   . Hypertension   . Menopausal symptom   . Obesity   . Pain, joint, ankle   . Panic disorder   . Retinal vascular changes   . Sprain of neck   . Stricture of ureter   . Upper respiratory infection   . Wheezing     Patient Active Problem List   Diagnosis Date Noted  . Essential hypertension 02/20/2013  . Chest discomfort 02/20/2013    Past Surgical History:  Procedure Laterality Date  . ABDOMINAL HYSTERECTOMY    . ANKLE SURGERY    . BREAST ENHANCEMENT SURGERY    . CHOLECYSTECTOMY    . TONSILLECTOMY      Prior to Admission medications   Medication Sig Start Date End Date Taking? Authorizing Provider  diazepam (VALIUM) 5 MG tablet Take 2.5 mg by mouth as needed.  02/18/13  Yes Historical Provider, MD  losartan-hydrochlorothiazide (HYZAAR) 50-12.5 MG tablet Take 1 tablet by mouth daily.   Yes Historical Provider, MD    cephALEXin (KEFLEX) 500 MG capsule Take 1 capsule (500 mg total) by mouth 4 (four) times daily. 12/22/15 01/01/16  Nena Polio, MD    Allergies Review of patient's allergies indicates no known allergies.  Family History  Problem Relation Age of Onset  . Hypercholesterolemia Mother   . Hypertension Mother   . Heart disease Father   . Hypertension Father   . Cancer Father     Social History Social History  Substance Use Topics  . Smoking status: Never Smoker  . Smokeless tobacco: Never Used  . Alcohol use Yes     Comment: social    Review of Systems {Constitutional: No fever/chills Eyes: No visual changes. ENT: No sore throat. Cardiovascular: Denies chest pain. Respiratory: Denies shortness of breath. Gastrointestinal:  abdominal pain.  nausea, no vomiting.  No diarrhea.  No constipation. Genitourinary: Negative for dysuria. Musculoskeletal: Negative for back pain. Skin: Negative for rash.  10-point ROS otherwise negative.  ____________________________________________   PHYSICAL EXAM:  VITAL SIGNS: ED Triage Vitals  Enc Vitals Group     BP 12/22/15 0852 (!) 141/89     Pulse Rate 12/22/15 0852 94     Resp 12/22/15 0852 18     Temp 12/22/15 0852 97.9 F (36.6 C)     Temp Source 12/22/15 0852 Oral     SpO2 12/22/15  0852 98 %     Weight 12/22/15 0851 210 lb (95.3 kg)     Height 12/22/15 0851 5\' 4"  (1.626 m)     Head Circumference --      Peak Flow --      Pain Score 12/22/15 0851 7     Pain Loc --      Pain Edu? --      Excl. in [place]? --     Constitutional: Alert and oriented. Patient appears somewhat uncomfortable Eyes: Conjunctivae are normal. PERRL. EOMI. Head: Atraumatic. Nose: No congestion/rhinnorhea. Mouth/Throat: Mucous membranes are moist.  Oropharynx non-erythematous. Neck: No stridor.   Cardiovascular: Normal rate, regular rhythm. Grossly normal heart sounds.  Good peripheral circulation. Respiratory: Normal respiratory effort.  No  retractions. Lungs CTAB. Gastrointestinal: Soft tender palpation and deep percussion in the right side of the abdomen. No distention. No abdominal bruits. There is a small amount of right CVA tenderness CVA tenderness. Musculoskeletal: No lower extremity tenderness nor edema.  No joint effusions. Neurologic:  Normal speech and language. No gross focal neurologic deficits are appreciated. No gait instability. Skin:  Skin is warm, dry and intact. No rash noted. Psychiatric: Mood and affect are normal. Speech and behavior are normal.  ____________________________________________   LABS (all labs ordered are listed, but only abnormal results are displayed)  Labs Reviewed  COMPREHENSIVE METABOLIC PANEL - Abnormal; Notable for the following:       Result Value   Glucose, Bld 103 (*)    All other components within normal limits  URINALYSIS COMPLETEWITH MICROSCOPIC ([facility] ONLY) - Abnormal; Notable for the following:    Color, Urine YELLOW (*)    APPearance CLOUDY (*)    Leukocytes, UA TRACE (*)    Bacteria, UA RARE (*)    Squamous Epithelial / LPF TOO NUMEROUS TO COUNT (*)    All other components within normal limits  LIPASE, BLOOD  CBC  TROPONIN I  PREGNANCY, URINE   ____________________________________________  EKG  KG read and interpreted by me shows normal sinus rhythm rate of 89 normal axis essentially normal EKG ____________________________________________  RADIOLOGY Study Result   CLINICAL DATA:  Left upper quadrant abdominal pain and nausea beginning this morning.  EXAM: CT ABDOMEN AND PELVIS WITH CONTRAST  TECHNIQUE: Multidetector CT imaging of the abdomen and pelvis was performed using the standard protocol following bolus administration of intravenous contrast.  CONTRAST:  165mL ISOVUE-300 IOPAMIDOL (ISOVUE-300) INJECTION 61%  COMPARISON:  In 04/14/2010  FINDINGS: Lower chest: Normal  Hepatobiliary: Previous cholecystectomy. No liver  parenchymal lesion. Biliary ductal prominence, within normal limits following cholecystectomy.  Pancreas: Normal  Spleen: Normal  Adrenals/Urinary Tract: Adrenal glands are normal. Kidneys are normal.  Stomach/Bowel: No acute bowel pathology.  Vascular/Lymphatic: Normal  Reproductive: Probable previous partial hysterectomy. Right ovary is identified and contains several cysts, likely functional cyst. Left ovary not specifically visualized.  Other: None  Musculoskeletal: Normal  IMPRESSION: No cause of the presenting symptoms is identified. No evidence of intestinal or urinary tract disease.  Previous cholecystectomy. Biliary ductal prominence within normal limits following cholecystectomy.   Electronically Signed   By: Nelson Chimes M.D.   On: 12/22/2015 12:08     ____________________________________________   PROCEDURES  Procedure(s) performed:  Procedures  Critical Care performed:   ____________________________________________   INITIAL IMPRESSION / ASSESSMENT AND PLAN / ED COURSE  Pertinent labs & imaging results that were available during my care of the patient were reviewed by me and considered in my medical decision making (  see chart for details).    Clinical Course  Patient did well and [place] sandwich in the ER without return of pain she does report she has urgency and frequency but no dysuria we'll treat her for UTI as well. ____________________________________________   FINAL CLINICAL IMPRESSION(S) / ED DIAGNOSES  Final diagnoses:  Generalized abdominal pain  Acute cystitis with hematuria      NEW MEDICATIONS STARTED DURING THIS VISIT:  New Prescriptions   CEPHALEXIN (KEFLEX) 500 MG CAPSULE    Take 1 capsule (500 mg total) by mouth 4 (four) times daily.     Note:  This document was prepared using Dragon voice recognition software and may include unintentional dictation errors.    Nena Polio, MD 12/22/15  [phone]

## 2015-12-25 ENCOUNTER — Other Ambulatory Visit: Payer: Self-pay | Admitting: Neurology

## 2015-12-25 DIAGNOSIS — S24111S Complete lesion at T1 level of thoracic spinal cord, sequela: Secondary | ICD-10-CM

## 2016-01-09 ENCOUNTER — Ambulatory Visit
Admission: RE | Admit: 2016-01-09 | Discharge: 2016-01-09 | Disposition: A | Discharge status: Home / Self Care | Payer: Commercial Managed Care - HMO | Source: Ambulatory Visit | Attending: Neurology | Admitting: Neurology

## 2016-01-09 DIAGNOSIS — S24111S Complete lesion at T1 level of thoracic spinal cord, sequela: Secondary | ICD-10-CM

## 2016-01-09 MED ORDER — GADOBENATE DIMEGLUMINE 529 MG/ML IV SOLN
20.0000 mL | Freq: Once | INTRAVENOUS | Status: AC | PRN
  Administered 2016-01-09: 20 mL via INTRAVENOUS

## 2016-02-04 ENCOUNTER — Emergency Department: Payer: Commercial Managed Care - HMO

## 2016-02-04 ENCOUNTER — Encounter: Payer: Self-pay | Admitting: Emergency Medicine

## 2016-02-04 ENCOUNTER — Emergency Department
Admission: EM | Admit: 2016-02-04 | Discharge: 2016-02-04 | Disposition: A | Discharge status: Home / Self Care | Payer: Commercial Managed Care - HMO | Attending: Emergency Medicine | Admitting: Emergency Medicine

## 2016-02-04 DIAGNOSIS — M7918 Myalgia, other site: Secondary | ICD-10-CM

## 2016-02-04 DIAGNOSIS — Y9389 Activity, other specified: Secondary | ICD-10-CM | POA: Insufficient documentation

## 2016-02-04 DIAGNOSIS — I10 Essential (primary) hypertension: Secondary | ICD-10-CM | POA: Diagnosis not present

## 2016-02-04 DIAGNOSIS — E785 Hyperlipidemia, unspecified: Secondary | ICD-10-CM | POA: Insufficient documentation

## 2016-02-04 DIAGNOSIS — S161XXA Strain of muscle, fascia and tendon at neck level, initial encounter: Secondary | ICD-10-CM | POA: Diagnosis not present

## 2016-02-04 DIAGNOSIS — Y999 Unspecified external cause status: Secondary | ICD-10-CM | POA: Insufficient documentation

## 2016-02-04 DIAGNOSIS — Z79899 Other long term (current) drug therapy: Secondary | ICD-10-CM | POA: Diagnosis not present

## 2016-02-04 DIAGNOSIS — Y92410 Unspecified street and highway as the place of occurrence of the external cause: Secondary | ICD-10-CM | POA: Diagnosis not present

## 2016-02-04 DIAGNOSIS — S199XXA Unspecified injury of neck, initial encounter: Secondary | ICD-10-CM | POA: Diagnosis present

## 2016-02-04 DIAGNOSIS — Z8542 Personal history of malignant neoplasm of other parts of uterus: Secondary | ICD-10-CM | POA: Insufficient documentation

## 2016-02-04 MED ORDER — CYCLOBENZAPRINE HCL 10 MG PO TABS
10.0000 mg | ORAL_TABLET | Freq: Once | ORAL | Status: AC
  Administered 2016-02-04: 10 mg via ORAL
  Filled 2016-02-04: qty 1

## 2016-02-04 MED ORDER — IBUPROFEN 600 MG PO TABS: 600.0000 mg | ORAL_TABLET | Freq: Three times a day (TID) | ORAL | 0 refills | Status: DC | PRN

## 2016-02-04 MED ORDER — OXYCODONE-ACETAMINOPHEN 7.5-325 MG PO TABS: 1.0000 | ORAL_TABLET | Freq: Four times a day (QID) | ORAL | 0 refills | Status: DC | PRN

## 2016-02-04 MED ORDER — METHOCARBAMOL 750 MG PO TABS
750.0000 mg | ORAL_TABLET | Freq: Four times a day (QID) | ORAL | 0 refills | Status: DC
Start: 2016-02-04 — End: 2018-06-14

## 2016-02-04 MED ORDER — TRAMADOL HCL 50 MG PO TABS
50.0000 mg | ORAL_TABLET | Freq: Once | ORAL | Status: AC
  Administered 2016-02-04: 50 mg via ORAL
  Filled 2016-02-04: qty 1
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
## 2016-05-19 ENCOUNTER — Other Ambulatory Visit: Payer: Self-pay | Admitting: Neurological Surgery

## 2016-05-19 DIAGNOSIS — G950 Syringomyelia and syringobulbia: Secondary | ICD-10-CM

## 2016-05-27 ENCOUNTER — Other Ambulatory Visit: Payer: Self-pay | Admitting: Urology

## 2016-05-27 DIAGNOSIS — R310 Gross hematuria: Secondary | ICD-10-CM

## 2016-05-30 ENCOUNTER — Ambulatory Visit
Admission: RE | Admit: 2016-05-30 | Discharge: 2016-05-30 | Disposition: A | Discharge status: Home / Self Care | Payer: Commercial Managed Care - HMO | Source: Ambulatory Visit | Attending: Neurological Surgery | Admitting: Neurological Surgery

## 2016-05-30 DIAGNOSIS — M4802 Spinal stenosis, cervical region: Secondary | ICD-10-CM | POA: Insufficient documentation

## 2016-05-30 DIAGNOSIS — G950 Syringomyelia and syringobulbia: Secondary | ICD-10-CM | POA: Diagnosis not present

## 2016-05-30 DIAGNOSIS — M5030 Other cervical disc degeneration, unspecified cervical region: Secondary | ICD-10-CM | POA: Diagnosis not present

## 2016-05-30 LAB — POCT I-STAT CREATININE: Creatinine, Ser: 0.8 mg/dL (ref 0.44–1.00)

## 2016-05-30 MED ORDER — GADOBENATE DIMEGLUMINE 529 MG/ML IV SOLN
20.0000 mL | Freq: Once | INTRAVENOUS | Status: AC | PRN
  Administered 2016-05-30: 20 mL via INTRAVENOUS

## 2016-06-02 ENCOUNTER — Ambulatory Visit: Payer: 59

## 2016-06-09 ENCOUNTER — Ambulatory Visit
Admission: RE | Admit: 2016-06-09 | Discharge: 2016-06-09 | Disposition: A | Discharge status: Home / Self Care | Payer: Commercial Managed Care - HMO | Source: Ambulatory Visit | Attending: Urology | Admitting: Urology

## 2016-06-09 DIAGNOSIS — R310 Gross hematuria: Secondary | ICD-10-CM | POA: Diagnosis present

## 2016-06-09 DIAGNOSIS — R109 Unspecified abdominal pain: Secondary | ICD-10-CM | POA: Diagnosis present

## 2016-06-09 DIAGNOSIS — K760 Fatty (change of) liver, not elsewhere classified: Secondary | ICD-10-CM | POA: Insufficient documentation

## 2016-06-09 DIAGNOSIS — Z90710 Acquired absence of both cervix and uterus: Secondary | ICD-10-CM | POA: Diagnosis not present

## 2016-06-09 MED ORDER — IOPAMIDOL (ISOVUE-300) INJECTION 61%
125.0000 mL | Freq: Once | INTRAVENOUS | Status: AC | PRN
  Administered 2016-06-09: 125 mL via INTRAVENOUS

## 2016-07-17 ENCOUNTER — Emergency Department
Admission: EM | Admit: 2016-07-17 | Discharge: 2016-07-17 | Disposition: A | Discharge status: Home / Self Care | Payer: 59 | Attending: Emergency Medicine | Admitting: Emergency Medicine

## 2016-07-17 ENCOUNTER — Emergency Department: Payer: 59

## 2016-07-17 ENCOUNTER — Encounter: Payer: Self-pay | Admitting: Emergency Medicine

## 2016-07-17 DIAGNOSIS — Y999 Unspecified external cause status: Secondary | ICD-10-CM | POA: Insufficient documentation

## 2016-07-17 DIAGNOSIS — Z79899 Other long term (current) drug therapy: Secondary | ICD-10-CM | POA: Insufficient documentation

## 2016-07-17 DIAGNOSIS — Y929 Unspecified place or not applicable: Secondary | ICD-10-CM | POA: Diagnosis not present

## 2016-07-17 DIAGNOSIS — Z791 Long term (current) use of non-steroidal anti-inflammatories (NSAID): Secondary | ICD-10-CM | POA: Diagnosis not present

## 2016-07-17 DIAGNOSIS — I10 Essential (primary) hypertension: Secondary | ICD-10-CM | POA: Diagnosis not present

## 2016-07-17 DIAGNOSIS — X58XXXA Exposure to other specified factors, initial encounter: Secondary | ICD-10-CM | POA: Diagnosis not present

## 2016-07-17 DIAGNOSIS — S39012A Strain of muscle, fascia and tendon of lower back, initial encounter: Secondary | ICD-10-CM | POA: Insufficient documentation

## 2016-07-17 DIAGNOSIS — S3992XA Unspecified injury of lower back, initial encounter: Secondary | ICD-10-CM | POA: Diagnosis present

## 2016-07-17 DIAGNOSIS — Y939 Activity, unspecified: Secondary | ICD-10-CM | POA: Insufficient documentation

## 2016-07-17 DIAGNOSIS — M62830 Muscle spasm of back: Secondary | ICD-10-CM

## 2016-07-17 MED ORDER — KETOROLAC TROMETHAMINE 60 MG/2ML IM SOLN
30.0000 mg | Freq: Once | INTRAMUSCULAR | Status: AC
  Administered 2016-07-17: 30 mg via INTRAMUSCULAR
  Filled 2016-07-17: qty 2

## 2016-07-17 MED ORDER — PREDNISONE 10 MG (21) PO TBPK: ORAL_TABLET | ORAL | 0 refills | Status: DC

## 2016-07-17 MED ORDER — CYCLOBENZAPRINE HCL 5 MG PO TABS: 5.0000 mg | ORAL_TABLET | Freq: Three times a day (TID) | ORAL | 0 refills | Status: DC | PRN

## 2016-07-17 MED ORDER — LORAZEPAM 2 MG/ML IJ SOLN
1.0000 mg | Freq: Once | INTRAMUSCULAR | Status: AC
  Administered 2016-07-17: 1 mg via INTRAMUSCULAR
  Filled 2016-07-17: qty 1

## 2016-07-17 NOTE — Discharge Instructions (Signed)
Take medication as prescribed. Return to emergency department if symptoms worsen and follow-up with PCP as needed.    Follow up with Ortho-[facility] Prescott [place], PA-C.

## 2016-07-17 NOTE — ED Provider Notes (Signed)
[facility] Emergency Department Provider Note   ____________________________________________   I have reviewed the triage vital signs and the nursing notes.   HISTORY  Chief Complaint Back Pain    HPI Meagan Becker is a 51 y.o. female presents with thoracolumbar back pain x 1 week. Patient reports seeing her PCP and receiving trigger point injections without relief. In addition, today she was picking up her granddaughter and noted a catching sensation along the right side of her back. Patient describes muscle spasms and pain (10/10). She exhibits guarded posture with deliberate movements secondary to the pain. Patient denies fever, chills, headache, abdominal pain, chest pain, bowel/bladder dysfunction or saddle anesthesia.    Past Medical History:  Diagnosis Date  . Anxiety disorder   . Anxiety state   . Cellulitis   . Headache   . Hyperlipidemia   . Hypertension   . Menopausal symptom   . Obesity   . Pain, joint, ankle   . Panic disorder   . Retinal vascular changes   . Sprain of neck   . Stricture of ureter   . Upper respiratory infection   . Wheezing     Patient Active Problem List   Diagnosis Date Noted  . Essential hypertension 02/20/2013  . Chest discomfort 02/20/2013    Past Surgical History:  Procedure Laterality Date  . ABDOMINAL HYSTERECTOMY    . ANKLE SURGERY    . BREAST ENHANCEMENT SURGERY    . CHOLECYSTECTOMY    . TONSILLECTOMY      Prior to Admission medications   Medication Sig Start Date End Date Taking? Authorizing Provider  diazepam (VALIUM) 5 MG tablet Take 2.5 mg by mouth as needed.  02/18/13   Historical Provider, MD  ibuprofen (ADVIL,MOTRIN) 600 MG tablet Take 1 tablet (600 mg total) by mouth every 8 (eight) hours as needed. 02/04/16   Sable Feil, PA-C  losartan-hydrochlorothiazide (HYZAAR) 50-12.5 MG tablet Take 1 tablet by mouth daily.    Historical Provider, MD  methocarbamol (ROBAXIN-750) 750 MG  tablet Take 1 tablet (750 mg total) by mouth 4 (four) times daily. 02/04/16   Sable Feil, PA-C  oxyCODONE-acetaminophen (PERCOCET) 7.5-325 MG tablet Take 1 tablet by mouth every 6 (six) hours as needed for severe pain. 02/04/16   Sable Feil, PA-C    Allergies Patient has no known allergies.  Family History  Problem Relation Age of Onset  . Hypercholesterolemia Mother   . Hypertension Mother   . Heart disease Father   . Hypertension Father   . Cancer Father     Social History Social History  Substance Use Topics  . Smoking status: Never Smoker  . Smokeless tobacco: Never Used  . Alcohol use No    Review of Systems Constitutional: No fever/chills Eyes: No visual changes. ENT: No sore throat. Cardiovascular: Denies chest pain. Respiratory: Denies cough Gastrointestinal: No abdominal pain.  No nausea, no vomiting.   Genitourinary: Negative for dysuria. Musculoskeletal: Back pain Skin: Negative for rash. Neurological: Negative for headaches  ____________________________________________   PHYSICAL EXAM:  VITAL SIGNS: ED Triage Vitals  Enc Vitals Group     BP 07/17/16 2110 140/74     Pulse Rate 07/17/16 2110 92     Resp 07/17/16 2110 18     Temp 07/17/16 2110 98.4 F (36.9 C)     Temp Source 07/17/16 2110 Oral     SpO2 07/17/16 2110 96 %     Weight 07/17/16 2111 204 lb (92.5  kg)     Height 07/17/16 2111 5\' 3"  (1.6 m)     Head Circumference --      Peak Flow --      Pain Score 07/17/16 2108 9     Pain Loc --      Pain Edu? --      Excl. in [place]? --     Constitutional: Alert and oriented. Well appearing and in no acute distress. Eyes: Conjunctivae are normal. Head: Atraumatic. Nose: No congestion/rhinnorhea. Mouth/Throat: Mucous membranes are moist.   Cardiovascular: Normal rate, regular rhythm.  Good peripheral circulation. Respiratory: Normal respiratory effort.  No SOB Genitourinary: deferred Musculoskeletal: Thoracolumbar back pain with muscle  spasms. No radicular symptoms. Strength, ROM and sensation of bilateral lower extremities intact.  Neurologic:  Normal speech and language. No gross focal neurologic deficits are appreciated.  Skin:  Skin is warm, dry and intact. No rash noted. Psychiatric: Mood and affect are normal. Speech and behavior are normal.  ____________________________________________   LABS (all labs ordered are listed, but only abnormal results are displayed)  Labs Reviewed - No data to display ____________________________________________  EKG none  ____________________________________________  RADIOLOGY Lumbar radiograph   ____________________________________________   PROCEDURES  Procedure(s) performed:no     Critical Care performed: no ____________________________________________   INITIAL IMPRESSION / ASSESSMENT AND PLAN / ED COURSE  Pertinent labs & imaging results that were available during my care of the patient were reviewed by me and considered in my medical decision making (see chart for details).  Patient symptoms consistent with muscle strain and spasms. Physical exam and imaging are reassuring. Also, patient did not present with bowel/bladder dysfunction or saddle anesthesia.  Pt responded well to initial pain management. Pt be given a Prednisone taper and Flexeril for continued symptom management. Pt encouraged to follow up with her Orthopedic, PCP as needed or return to the ED if symptoms worsened.   Pt feels improved after observation and/or treatment in ED.     ____________________________________________   FINAL CLINICAL IMPRESSION(S) / ED DIAGNOSES  Final diagnoses:  Strain of lumbar region, initial encounter  Muscle spasm of back      NEW MEDICATIONS STARTED DURING THIS VISIT:  New Prescriptions   No medications on file     Note:  This document was prepared using Dragon voice recognition software and may include unintentional dictation errors.   Laroy Apple  Enjoli Tidd, PA-C 07/17/16 [place], MD 07/18/16 [phone]

## 2016-07-17 NOTE — ED Triage Notes (Signed)
Pt arrives ambulatory to triage with c/o back pain x1 week. Pt was seen by primary physician Thursday but states that the pain is increasing. Pt denies injury or trauma to back. Pt is in NAD at his time.

## 2016-09-16 ENCOUNTER — Encounter: Payer: Self-pay | Admitting: Emergency Medicine

## 2016-09-16 ENCOUNTER — Ambulatory Visit
Admission: EM | Admit: 2016-09-16 | Discharge: 2016-09-16 | Disposition: A | Discharge status: Home / Self Care | Payer: 59 | Attending: Family Medicine | Admitting: Family Medicine

## 2016-09-16 DIAGNOSIS — J029 Acute pharyngitis, unspecified: Secondary | ICD-10-CM | POA: Diagnosis not present

## 2016-09-16 LAB — RAPID STREP SCREEN (MED CTR MEBANE ONLY): Streptococcus, Group A Screen (Direct): NEGATIVE

## 2016-09-16 NOTE — Discharge Instructions (Signed)
Salt water gargles as discussed. Throat cultures will be available in 48 hours. Follow-up with your primary care physician if you don't feel improvement in a week. Used Flonase daily for about one month.

## 2016-09-16 NOTE — ED Triage Notes (Signed)
Patient states she has a sore throat swollen glands and has notices what looks like blisters on her throat.

## 2016-09-16 NOTE — ED Provider Notes (Addendum)
CSN: [id number]     Arrival date & time 09/16/16  1349 History   First MD Initiated Contact with Patient 09/16/16 1441     Chief Complaint  Patient presents with  . Sore Throat   (Consider location/radiation/quality/duration/timing/severity/associated sxs/prior Treatment) HPI  This a 51 year old female presents with a sore throat with swollen glands" blisters" on her posterior pharynx that she noticed the other day. She states that she's been drinking diet tea and last week  Had an episode where she felt as though her throat was swelling shut although she did not have any respiratory distress at that time. She stopped drinking the tea and improved. 2 days ago she began having this sore throat;she became very alarmed because her 33 year old daughter was diagnosed with strep throat  Then diagnosed with leukemia and has recently died. She is still grieving very deeply for her daughter. Is concerned that she may have the same thing. She has been under a great deal of stress recently going through court proceedings for adoption of her daughters daughter (granddaughter). Has had no fever or chills. She is afebrile today pulse rate of 101 due to anxiety. Blood pressure 136/74 O2 sats 99% on room air. She has a mild cough that is nonproductive.      Past Medical History:  Diagnosis Date  . Anxiety disorder   . Anxiety state   . Cellulitis   . Headache   . Hyperlipidemia   . Hypertension   . Menopausal symptom   . Obesity   . Pain, joint, ankle   . Panic disorder   . Retinal vascular changes   . Sprain of neck   . Stricture of ureter   . Upper respiratory infection   . Wheezing    Past Surgical History:  Procedure Laterality Date  . ABDOMINAL HYSTERECTOMY    . ANKLE SURGERY    . BREAST ENHANCEMENT SURGERY    . CHOLECYSTECTOMY    . TONSILLECTOMY     Family History  Problem Relation Age of Onset  . Hypercholesterolemia Mother   . Hypertension Mother   . Heart disease Father   .  Hypertension Father   . Cancer Father   . Cancer Other    Social History  Substance Use Topics  . Smoking status: Never Smoker  . Smokeless tobacco: Never Used  . Alcohol use Yes   OB History    No data available     Review of Systems  Constitutional: Negative for chills, fatigue and fever.  HENT: Positive for postnasal drip and sore throat.   Respiratory: Positive for cough.   All other systems reviewed and are negative.   Allergies  Patient has no known allergies.  Home Medications   Prior to Admission medications   Medication Sig Start Date End Date Taking? Authorizing Provider  diazepam (VALIUM) 5 MG tablet Take 2.5 mg by mouth as needed.  02/18/13  Yes [provider]  ibuprofen (ADVIL,MOTRIN) 600 MG tablet Take 1 tablet (600 mg total) by mouth every 8 (eight) hours as needed. 02/04/16  Yes Sable Feil, PA-C  losartan-hydrochlorothiazide (HYZAAR) 50-12.5 MG tablet Take 1 tablet by mouth daily.   Yes [provider]  cyclobenzaprine (FLEXERIL) 5 MG tablet Take 1 tablet (5 mg total) by mouth 3 (three) times daily as needed for muscle spasms. 07/17/16   Little, Traci M, PA-C  methocarbamol (ROBAXIN-750) 750 MG tablet Take 1 tablet (750 mg total) by mouth 4 (four) times daily. 02/04/16   Tamala Julian,  Hinda Lenis, PA-C  oxyCODONE-acetaminophen (PERCOCET) 7.5-325 MG tablet Take 1 tablet by mouth every 6 (six) hours as needed for severe pain. 02/04/16   Sable Feil, PA-C  predniSONE (STERAPRED UNI-PAK 21 TAB) 10 MG (21) TBPK tablet Take 6 tablets on day 1. Take 5 tablets on day 2. Take 4 tablets on day 3. Take 3 tablets on day 4. Take 2 tablets on day 5. Take 1 tablets on day 6. 07/17/16   Little, Traci M, PA-C   Meds Ordered and Administered this Visit  Medications - No data to display  BP 136/74 (BP Location: Left Arm)   Pulse (!) 101   Temp 98.4 F (36.9 C) (Oral)   Resp 18   Ht 5\' 3"  (1.6 m)   Wt 199 lb (90.3 kg)   SpO2 99%   BMI 35.25 kg/m  No data  found.   Physical Exam  Constitutional: She appears well-developed and well-nourished. No distress.  HENT:  Head: Normocephalic and atraumatic.  Right Ear: External ear normal.  Left Ear: External ear normal.  Mouth/Throat: No oropharyngeal exudate.  Examination of the pharynx shows no exudate. No ulcers seen in the posterior pharynx. She has no exudate present. She has lymphoid type tissue seen in the posterior pharynx into areas. Open ulcers present.  Eyes: Pupils are equal, round, and reactive to light. Right eye exhibits no discharge. Left eye exhibits no discharge.  Neck: Normal range of motion. Neck supple.  Pulmonary/Chest: No stridor.  Lymphadenopathy:    She has no cervical adenopathy.  Skin: She is not diaphoretic.  Nursing note and vitals reviewed.   Urgent Care Course     Procedures (including critical care time)  Labs Review Labs Reviewed  RAPID STREP SCREEN (NOT AT [facility])  CULTURE, GROUP A STREP [facility])    Imaging Review No results found.   Visual Acuity Review  Right Eye Distance:   Left Eye Distance:   Bilateral Distance:    Right Eye Near:   Left Eye Near:    Bilateral Near:         MDM   1. Viral pharyngitis    Discharge Medication List as of 09/16/2016  3:10 PM    Plan: 1. Test/x-ray results and diagnosis reviewed with patient 2. rx as per orders; risks, benefits, potential side effects reviewed with patient 3. Recommend supportive treatment withSalt Water gargles for comfort or lozenges as necessary. Flonase may help with the postnasal drip. I told her this is likely a viral illness. We will culture her pharyngeal swab and the results will be available in 48 hours. Any positive findings will be treated accordingly. If she continues to have pain despite symptomatic treatment I recommended she follow-up with her primary care physician Dr. Clemmie Krill. 4. F/u prn if symptoms worsen or don't improve     Lorin Picket, PA-C 09/16/16 1827     Lorin Picket, PA-C 09/16/16 1831

## 2016-09-19 LAB — CULTURE, GROUP A STREP (THRC)

## 2017-09-27 ENCOUNTER — Encounter: Payer: Self-pay | Admitting: Family Medicine

## 2017-09-29 ENCOUNTER — Telehealth: Payer: Self-pay | Admitting: Gastroenterology

## 2017-09-29 NOTE — Telephone Encounter (Signed)
Gastroenterology Pre-Procedure Review  Request Date: 10/27/17  [facility] Requesting Physician: Dr. Marius Ditch  PATIENT REVIEW QUESTIONS: The patient responded to the following health history questions as indicated:    1. Are you having any GI issues? no 2. Do you have a personal history of Polyps? no 3. Do you have a family history of Colon Cancer or Polyps? no 4. Diabetes Mellitus? no 5. Joint replacements in the past 12 months?no 6. Major health problems in the past 3 months?no 7. Any artificial heart valves, MVP, or defibrillator?no    MEDICATIONS & ALLERGIES:    Patient reports the following regarding taking any anticoagulation/antiplatelet therapy:   Plavix, Coumadin, Eliquis, Xarelto, Lovenox, Pradaxa, Brilinta, or Effient? no Aspirin? no  Patient confirms/reports the following medications:  Current Outpatient Medications  Medication Sig Dispense Refill  . cyclobenzaprine (FLEXERIL) 5 MG tablet Take 1 tablet (5 mg total) by mouth 3 (three) times daily as needed for muscle spasms. 30 tablet 0  . diazepam (VALIUM) 5 MG tablet Take 2.5 mg by mouth as needed.     Marland Kitchen ibuprofen (ADVIL,MOTRIN) 600 MG tablet Take 1 tablet (600 mg total) by mouth every 8 (eight) hours as needed. 15 tablet 0  . losartan-hydrochlorothiazide (HYZAAR) 50-12.5 MG tablet Take 1 tablet by mouth daily.    . methocarbamol (ROBAXIN-750) 750 MG tablet Take 1 tablet (750 mg total) by mouth 4 (four) times daily. 20 tablet 0  . oxyCODONE-acetaminophen (PERCOCET) 7.5-325 MG tablet Take 1 tablet by mouth every 6 (six) hours as needed for severe pain. 12 tablet 0  . predniSONE (STERAPRED UNI-PAK 21 TAB) 10 MG (21) TBPK tablet Take 6 tablets on day 1. Take 5 tablets on day 2. Take 4 tablets on day 3. Take 3 tablets on day 4. Take 2 tablets on day 5. Take 1 tablets on day 6. 21 tablet 0   No current facility-administered medications for this visit.     Patient confirms/reports the following allergies:  No Known Allergies  No  orders of the defined types were placed in this encounter.   AUTHORIZATION INFORMATION Primary Insurance: 1D#: Group #:  Secondary Insurance: 1D#: Group #:  SCHEDULE INFORMATION: Date: 10/27/17  [place] Time: Location:[facility]

## 2017-10-01 ENCOUNTER — Other Ambulatory Visit: Payer: Self-pay

## 2017-10-01 DIAGNOSIS — Z1211 Encounter for screening for malignant neoplasm of colon: Secondary | ICD-10-CM

## 2017-10-27 ENCOUNTER — Encounter: Admission: RE | Payer: Self-pay | Source: Ambulatory Visit

## 2017-10-27 ENCOUNTER — Ambulatory Visit: Admission: RE | Admit: 2017-10-27 | Payer: 59 | Source: Ambulatory Visit | Admitting: Gastroenterology

## 2017-10-27 SURGERY — COLONOSCOPY WITH PROPOFOL
Anesthesia: General

## 2018-01-24 ENCOUNTER — Encounter: Payer: Self-pay | Admitting: Emergency Medicine

## 2018-01-24 ENCOUNTER — Emergency Department: Payer: 59

## 2018-01-24 ENCOUNTER — Emergency Department
Admission: EM | Admit: 2018-01-24 | Discharge: 2018-01-24 | Disposition: A | Discharge status: Home / Self Care | Payer: 59 | Attending: Emergency Medicine | Admitting: Emergency Medicine

## 2018-01-24 DIAGNOSIS — R079 Chest pain, unspecified: Secondary | ICD-10-CM | POA: Diagnosis present

## 2018-01-24 DIAGNOSIS — K2100 Gastro-esophageal reflux disease with esophagitis, without bleeding: Secondary | ICD-10-CM

## 2018-01-24 DIAGNOSIS — E785 Hyperlipidemia, unspecified: Secondary | ICD-10-CM | POA: Diagnosis not present

## 2018-01-24 DIAGNOSIS — Z79899 Other long term (current) drug therapy: Secondary | ICD-10-CM | POA: Diagnosis not present

## 2018-01-24 DIAGNOSIS — I10 Essential (primary) hypertension: Secondary | ICD-10-CM | POA: Diagnosis not present

## 2018-01-24 LAB — BASIC METABOLIC PANEL
Anion gap: 9 (ref 5–15)
BUN: 11 mg/dL (ref 6–20)
CO2: 27 mmol/L (ref 22–32)
Calcium: 8.5 mg/dL — ABNORMAL LOW (ref 8.9–10.3)
Chloride: 99 mmol/L (ref 98–111)
Creatinine, Ser: 0.64 mg/dL (ref 0.44–1.00)
GFR calc Af Amer: >60 mL/min (ref >60)
Glucose, Bld: 93 mg/dL (ref 70–99)
Potassium: 3.5 mmol/L (ref 3.5–5.1)
SODIUM: 135 mmol/L (ref 135–145)

## 2018-01-24 LAB — CBC
HCT: 35.9 % — ABNORMAL LOW (ref 36.0–46.0)
HEMOGLOBIN: 11.9 g/dL — AB (ref 12.0–15.0)
MCH: 29.6 pg (ref 26.0–34.0)
MCHC: 33.1 g/dL (ref 30.0–36.0)
MCV: 89.3 fL (ref 80.0–100.0)
Platelets: 403 10*3/uL — ABNORMAL HIGH (ref 150–400)
RBC: 4.02 MIL/uL (ref 3.87–5.11)
RDW: 12.5 % (ref 11.5–15.5)
WBC: 10.3 10*3/uL (ref 4.0–10.5)
nRBC: 0 % (ref 0.0–0.2)

## 2018-01-24 LAB — TROPONIN I: Troponin I: <0.03 ng/mL (ref <0.03)

## 2018-01-24 MED ORDER — FAMOTIDINE 40 MG PO TABS: 40.0000 mg | ORAL_TABLET | Freq: Every evening | ORAL | 1 refills | Status: DC

## 2018-01-24 MED ORDER — LIDOCAINE VISCOUS HCL 2 % MT SOLN
15.0000 mL | Freq: Once | OROMUCOSAL | Status: AC
  Administered 2018-01-24: 15 mL via OROMUCOSAL
  Filled 2018-01-24: qty 15

## 2018-01-24 MED ORDER — SUCRALFATE 1 G PO TABS: 1.0000 g | ORAL_TABLET | Freq: Four times a day (QID) | ORAL | 0 refills | Status: DC

## 2018-01-24 NOTE — Discharge Instructions (Signed)
Please seek medical attention for any high fevers, chest pain, shortness of breath, change in behavior, persistent vomiting, bloody stool or any other new or concerning symptoms.  

## 2018-01-24 NOTE — ED Triage Notes (Signed)
Patient presents to the ED with intermittent "dull/achy" chest pain for the past 2-3 days.  Patient denies shortness of breath.  Patient states she has taken medication for acid reflux that has not relieved pain.  Patient has a bright red rash to neck and upper chest.  Patient states she always gets rash when she is nervous.  Patient is in no obvious distress at this time.

## 2018-01-24 NOTE — ED Provider Notes (Signed)
[facility] Emergency Department Provider Note   ____________________________________________   I have reviewed the triage vital signs and the nursing notes.   HISTORY  Chief Complaint Chest Pain   History limited by: Not Limited   HPI Meagan Becker is a 52 y.o. female who presents to the emergency department today because of concerns for chest pain.  Is located in the center and right chest.  She states it is been present for almost 1 week.  She has not had any associated shortness of breath.  She has noticed sometimes at night when she lies down she gets a feeling of indigestion.  She has tried taking an over-the-counter antacid without any significant relief.  The patient denies similar pain in the past.  She denies any recent fevers.   Per medical record review patient has a history of HLD, HTN.   Past Medical History:  Diagnosis Date  . Anxiety disorder   . Anxiety state   . Cellulitis   . Headache   . Hyperlipidemia   . Hypertension   . Menopausal symptom   . Obesity   . Pain, joint, ankle   . Panic disorder   . Retinal vascular changes   . Sprain of neck   . Stricture of ureter   . Upper respiratory infection   . Wheezing     Patient Active Problem List   Diagnosis Date Noted  . Essential hypertension 02/20/2013  . Chest discomfort 02/20/2013    Past Surgical History:  Procedure Laterality Date  . ABDOMINAL HYSTERECTOMY    . ANKLE SURGERY    . BREAST ENHANCEMENT SURGERY    . CHOLECYSTECTOMY    . TONSILLECTOMY      Prior to Admission medications   Medication Sig Start Date End Date Taking? Authorizing Provider  cyclobenzaprine (FLEXERIL) 5 MG tablet Take 1 tablet (5 mg total) by mouth 3 (three) times daily as needed for muscle spasms. 07/17/16   Little, Traci M, PA-C  diazepam (VALIUM) 5 MG tablet Take 2.5 mg by mouth as needed.  02/18/13   [provider]  ibuprofen (ADVIL,MOTRIN) 600 MG tablet Take 1 tablet (600 mg  total) by mouth every 8 (eight) hours as needed. 02/04/16   Sable Feil, PA-C  losartan-hydrochlorothiazide (HYZAAR) 50-12.5 MG tablet Take 1 tablet by mouth daily.    [provider]  methocarbamol (ROBAXIN-750) 750 MG tablet Take 1 tablet (750 mg total) by mouth 4 (four) times daily. 02/04/16   Sable Feil, PA-C  oxyCODONE-acetaminophen (PERCOCET) 7.5-325 MG tablet Take 1 tablet by mouth every 6 (six) hours as needed for severe pain. 02/04/16   Sable Feil, PA-C  predniSONE (STERAPRED UNI-PAK 21 TAB) 10 MG (21) TBPK tablet Take 6 tablets on day 1. Take 5 tablets on day 2. Take 4 tablets on day 3. Take 3 tablets on day 4. Take 2 tablets on day 5. Take 1 tablets on day 6. 07/17/16   Little, Traci M, PA-C    Allergies Patient has no known allergies.  Family History  Problem Relation Age of Onset  . Hypercholesterolemia Mother   . Hypertension Mother   . Heart disease Father   . Hypertension Father   . Cancer Father   . Cancer Other     Social History Social History   Tobacco Use  . Smoking status: Never Smoker  . Smokeless tobacco: Never Used  Substance Use Topics  . Alcohol use: Yes  . Drug use: No  Types: Marijuana    Comment: past    Review of Systems Constitutional: No fever/chills Eyes: No visual changes. ENT: No sore throat. Cardiovascular: Positive for chest pain.  Respiratory: Denies shortness of breath. Gastrointestinal: No abdominal pain.  No nausea, no vomiting.  No diarrhea.   Genitourinary: Negative for dysuria. Musculoskeletal: Negative for back pain. Skin: Negative for rash. Neurological: Negative for headaches, focal weakness or numbness.  ____________________________________________   PHYSICAL EXAM:  VITAL SIGNS: ED Triage Vitals [01/24/18 1248]  Enc Vitals Group     BP (!) 146/100     Pulse Rate 91     Resp 18     Temp 98.4 F (36.9 C)     Temp Source Oral     SpO2 100 %     Weight 215 lb (97.5 kg)     Height 5\' 3"   (1.6 m)     Head Circumference      Peak Flow      Pain Score 4   Constitutional: Alert and oriented.  Eyes: Conjunctivae are normal.  ENT      Head: Normocephalic and atraumatic.      Nose: No congestion/rhinnorhea.      Mouth/Throat: Mucous membranes are moist.      Neck: No stridor. Hematological/Lymphatic/Immunilogical: No cervical lymphadenopathy. Cardiovascular: Normal rate, regular rhythm.  No murmurs, rubs, or gallops.  Respiratory: Normal respiratory effort without tachypnea nor retractions. Breath sounds are clear and equal bilaterally. No wheezes/rales/rhonchi. Gastrointestinal: Soft and non tender. No rebound. No guarding.  Genitourinary: Deferred Musculoskeletal: Normal range of motion in all extremities. No lower extremity edema. Neurologic:  Normal speech and language. No gross focal neurologic deficits are appreciated.  Skin:  Skin is warm, dry and intact. No rash noted. Psychiatric: Mood and affect are normal. Speech and behavior are normal. Patient exhibits appropriate insight and judgment.  ____________________________________________    LABS (pertinent positives/negatives)  Trop <0.03 CBC wbc 10.3, hgb 11.9, plt 403 BMP wnl except ca 8.5  ____________________________________________   EKG  I, Nance Pear, attending physician, personally viewed and interpreted this EKG  EKG Time: 1245 Rate: 92 Rhythm: normal sinus rhythm Axis: normal Intervals: qtc 437 QRS: narrow, q waves II, III, avf, v3-v6 ST changes: no st elevation Impression: abnormal ekg   ____________________________________________    RADIOLOGY  CXR No acute disease   ____________________________________________   PROCEDURES  Procedures  ____________________________________________   INITIAL IMPRESSION / ASSESSMENT AND PLAN / ED COURSE  Pertinent labs & imaging results that were available during my care of the patient were reviewed by me and considered in my medical  decision making (see chart for details).   Presented to the emergency department today because of concerns for right upper chest pain.  Differential would be broad including ACS pneumonia pneumothorax esophagitis costochondritis amongst other etiologies.  Patient's chest x-ray blood work without concerning findings.  Troponin was negative.  I would expect some elevation of the troponin patient was truly undergoing ACS given that there is been going on for a few days.  Of note however he EKG did show some new Q waves.  I did discuss this with the patient.  Last EKG was 2 years ago and she states she has been under a lot of stress in the interim.  At this point I doubt there is any acute ischemia present.  She did get some relief with viscous lidocaine that thus I think GERD and esophagitis more likely the cause of the patient's acute symptoms.  I discussed with the patient however importance of follow-up with cardiology.  Do think she would benefit from further cardiology evaluation.   ____________________________________________   FINAL CLINICAL IMPRESSION(S) / ED DIAGNOSES  Final diagnoses:  Nonspecific chest pain  Gastroesophageal reflux disease with esophagitis     Note: This dictation was prepared with Dragon dictation. Any transcriptional errors that result from this process are unintentional     Nance Pear, MD 01/24/18 1715

## 2018-06-14 ENCOUNTER — Other Ambulatory Visit: Payer: Self-pay

## 2018-06-14 ENCOUNTER — Ambulatory Visit (INDEPENDENT_AMBULATORY_CARE_PROVIDER_SITE_OTHER): Payer: Self-pay

## 2018-06-14 ENCOUNTER — Ambulatory Visit
Admission: EM | Admit: 2018-06-14 | Discharge: 2018-06-14 | Disposition: A | Discharge status: Home / Self Care | Payer: Self-pay | Attending: Family Medicine | Admitting: Family Medicine

## 2018-06-14 DIAGNOSIS — S60222A Contusion of left hand, initial encounter: Secondary | ICD-10-CM

## 2018-06-14 DIAGNOSIS — M25542 Pain in joints of left hand: Secondary | ICD-10-CM

## 2018-06-14 DIAGNOSIS — W231XXA Caught, crushed, jammed, or pinched between stationary objects, initial encounter: Secondary | ICD-10-CM

## 2018-06-14 DIAGNOSIS — M25532 Pain in left wrist: Secondary | ICD-10-CM

## 2018-06-14 MED ORDER — MELOXICAM 15 MG PO TABS: 15.0000 mg | ORAL_TABLET | Freq: Every day | ORAL | 0 refills | Status: DC | PRN

## 2018-06-14 NOTE — ED Provider Notes (Signed)
[facility]    CSN: [id number] Arrival date & time: 06/14/18  1904  History   Chief Complaint Chief Complaint  Patient presents with  . Hand Pain    left    HPI  53 year old female presents with a left hand injury.  Patient states that Meagan Becker was trying to keep her dog coming in the house.  Meagan Becker accidentally slammed her left hand in a sliding glass door.  Meagan Becker states that Meagan Becker is having wrist pain as well as hand pain.  Wrist pain predominantly on the radial aspect.  Hand pain, bruising, swelling noted at the second and third MCP joints.  Decreased range of motion of the index and middle finger.  Pain is currently severe, 8/10 in severity.  Worse with range of motion.  No relieving factors.  No other associated symptoms.  No other complaints.  PMH, Surgical Hx, Family Hx, Social History reviewed and updated as below.  Past Medical History:  Diagnosis Date  . Anxiety disorder   . Anxiety state   . Cellulitis   . Headache   . Hyperlipidemia   . Hypertension   . Menopausal symptom   . Obesity   . Pain, joint, ankle   . Panic disorder   . Retinal vascular changes   . Sprain of neck   . Stricture of ureter   . Upper respiratory infection   . Wheezing     Patient Active Problem List   Diagnosis Date Noted  . Essential hypertension 02/20/2013  . Chest discomfort 02/20/2013    Past Surgical History:  Procedure Laterality Date  . ABDOMINAL HYSTERECTOMY    . ANKLE SURGERY    . BREAST ENHANCEMENT SURGERY    . CHOLECYSTECTOMY    . TONSILLECTOMY      OB History   No obstetric history on file.      Home Medications    Prior to Admission medications   Medication Sig Start Date End Date Taking? Authorizing Provider  losartan-hydrochlorothiazide (HYZAAR) 50-12.5 MG tablet Take 1 tablet by mouth daily.   Yes [provider]  sertraline (ZOLOFT) 100 MG tablet  05/10/17  Yes [provider]  meloxicam (MOBIC) 15 MG tablet Take 1 tablet (15 mg  total) by mouth daily as needed for pain. 06/14/18   Coral Spikes, DO    Family History Family History  Problem Relation Age of Onset  . Hypercholesterolemia Mother   . Hypertension Mother   . Heart disease Father   . Hypertension Father   . Cancer Father   . Cancer Other     Social History Social History   Tobacco Use  . Smoking status: Never Smoker  . Smokeless tobacco: Never Used  Substance Use Topics  . Alcohol use: Yes  . Drug use: No    Types: Marijuana    Comment: past     Allergies   Patient has no known allergies.   Review of Systems Review of Systems  Constitutional: Negative.   Musculoskeletal:       Left hand & Wrist injury, pain.   Physical Exam Triage Vital Signs ED Triage Vitals  Enc Vitals Group     BP 06/14/18 1917 (!) 157/100     Pulse Rate 06/14/18 1917 96     Resp 06/14/18 1917 17     Temp 06/14/18 1917 98 F (36.7 C)     Temp Source 06/14/18 1917 Oral     SpO2 06/14/18 1917 100 %  Weight 06/14/18 1913 208 lb (94.3 kg)     Height 06/14/18 1913 5\' 3"  (1.6 m)     Head Circumference --      Peak Flow --      Pain Score 06/14/18 1913 8     Pain Loc --      Pain Edu? --      Excl. in [place]? --    Updated Vital Signs BP (!) 157/100   Pulse 96   Temp 98 F (36.7 C) (Oral)   Resp 17   Ht 5\' 3"  (1.6 m)   Wt 94.3 kg   SpO2 100%   BMI 36.85 kg/m   Visual Acuity Right Eye Distance:   Left Eye Distance:   Bilateral Distance:    Right Eye Near:   Left Eye Near:    Bilateral Near:     Physical Exam Vitals signs and nursing note reviewed.  Constitutional:      General: Meagan Becker is not in acute distress.    Appearance: Normal appearance.  HENT:     Head: Normocephalic and atraumatic.  Eyes:     General: No scleral icterus.    Conjunctiva/sclera: Conjunctivae normal.  Cardiovascular:     Rate and Rhythm: Normal rate and regular rhythm.  Pulmonary:     Effort: Pulmonary effort is normal.     Breath sounds: Normal breath sounds.   Musculoskeletal:     Comments: Left hand -tenderness, swelling, and mild bruising noted at the second and third MCP joints.  Neurovascularly intact distally.  Left wrist -patient with tenderness on the ulnar aspect as well as the radial aspect of the wrist.  Neurological:     Mental Status: Meagan Becker is alert.  Psychiatric:        Mood and Affect: Mood normal.        Behavior: Behavior normal.     UC Treatments / Results  Labs (all labs ordered are listed, but only abnormal results are displayed) Labs Reviewed - No data to display  EKG None  Radiology Dg Wrist Complete Left  Result Date: 06/14/2018 CLINICAL DATA:  Left wrist pain after injury. EXAM: LEFT WRIST - COMPLETE 3+ VIEW COMPARISON:  None. FINDINGS: There is no evidence of fracture or dislocation. There is no evidence of arthropathy or other focal bone abnormality. Soft tissues are unremarkable. IMPRESSION: Negative. Electronically Signed   By: Marijo Conception, M.D.   On: 06/14/2018 19:31   Dg Hand Complete Left  Result Date: 06/14/2018 CLINICAL DATA:  Left hand pain after injury. EXAM: LEFT HAND - COMPLETE 3+ VIEW COMPARISON:  None. FINDINGS: There is no evidence of fracture or dislocation. There is no evidence of arthropathy or other focal bone abnormality. Soft tissues are unremarkable. IMPRESSION: Negative. Electronically Signed   By: Marijo Conception, M.D.   On: 06/14/2018 19:30    Procedures Procedures (including critical care time)  Medications Ordered in UC Medications - No data to display  Initial Impression / Assessment and Plan / UC Course  I have reviewed the triage vital signs and the nursing notes.  Pertinent labs & imaging results that were available during my care of the patient were reviewed by me and considered in my medical decision making (see chart for details).    53 year old female presents with a hand injury.  X-ray negative.  Meloxicam as needed. Supportive care.  Final Clinical Impressions(s) /  UC Diagnoses   Final diagnoses:  Contusion of left hand, initial encounter  Discharge Instructions     No fracture.  Medication as needed for pain.  Take care  Dr. Lacinda Axon     ED Prescriptions    Medication Sig Dispense Auth. Provider   meloxicam (MOBIC) 15 MG tablet Take 1 tablet (15 mg total) by mouth daily as needed for pain. 30 tablet Coral Spikes, DO     Controlled Substance Prescriptions [place] Controlled Substance Registry consulted? Not Applicable   Coral Spikes, DO 06/14/18 1945

## 2018-06-14 NOTE — ED Triage Notes (Signed)
Patient states that she slammed her left hand in her door at home around 30 minutes ago. Instant swelling and bruising.

## 2018-06-14 NOTE — Discharge Instructions (Signed)
No fracture.  Medication as needed for pain.  Take care  Dr. Lacinda Axon

## 2018-12-30 ENCOUNTER — Ambulatory Visit
Admission: EM | Admit: 2018-12-30 | Discharge: 2018-12-30 | Disposition: A | Discharge status: Home / Self Care | Payer: BC Managed Care – PPO | Attending: Family Medicine | Admitting: Family Medicine

## 2018-12-30 ENCOUNTER — Encounter: Payer: Self-pay | Admitting: Emergency Medicine

## 2018-12-30 ENCOUNTER — Other Ambulatory Visit: Payer: Self-pay

## 2018-12-30 ENCOUNTER — Ambulatory Visit (INDEPENDENT_AMBULATORY_CARE_PROVIDER_SITE_OTHER): Payer: BC Managed Care – PPO

## 2018-12-30 DIAGNOSIS — M25532 Pain in left wrist: Secondary | ICD-10-CM | POA: Diagnosis not present

## 2018-12-30 DIAGNOSIS — S5002XA Contusion of left elbow, initial encounter: Secondary | ICD-10-CM | POA: Diagnosis not present

## 2018-12-30 DIAGNOSIS — M25522 Pain in left elbow: Secondary | ICD-10-CM | POA: Diagnosis not present

## 2018-12-30 DIAGNOSIS — W19XXXA Unspecified fall, initial encounter: Secondary | ICD-10-CM | POA: Diagnosis not present

## 2018-12-30 DIAGNOSIS — M25512 Pain in left shoulder: Secondary | ICD-10-CM | POA: Diagnosis not present

## 2018-12-30 DIAGNOSIS — S40012A Contusion of left shoulder, initial encounter: Secondary | ICD-10-CM

## 2018-12-30 DIAGNOSIS — S60212A Contusion of left wrist, initial encounter: Secondary | ICD-10-CM | POA: Diagnosis not present

## 2018-12-30 MED ORDER — HYDROCODONE-ACETAMINOPHEN 5-325 MG PO TABS: ORAL_TABLET | ORAL | 0 refills | Status: DC

## 2018-12-30 NOTE — Discharge Instructions (Signed)
Rest, ice, tylenol/ibuprofen as needed °

## 2018-12-30 NOTE — ED Triage Notes (Signed)
Patient in today after falling on 12/29/18 and landing on her left elbow. Patient is having pain in her left shoulder, elbow and wrist.

## 2019-01-01 NOTE — ED Provider Notes (Signed)
[facility]    CSN: [id number] Arrival date & time: 12/30/18  1609      History   Chief Complaint Chief Complaint  Patient presents with  . Fall    DOI 12/29/18    HPI Meagan Becker is a 53 y.o. female.   53 yo female with a c/o left shoulder, elbow and wrist pain after falling yesterday. Denies hitting her head or loss of consciousness. Denies any numbness/tingling.    Fall    Past Medical History:  Diagnosis Date  . Anxiety disorder   . Anxiety state   . Cellulitis   . Headache   . Hyperlipidemia   . Hypertension   . Menopausal symptom   . Obesity   . Pain, joint, ankle   . Panic disorder   . Retinal vascular changes   . Sprain of neck   . Stricture of ureter   . Upper respiratory infection   . Wheezing     Patient Active Problem List   Diagnosis Date Noted  . Essential hypertension 02/20/2013  . Chest discomfort 02/20/2013    Past Surgical History:  Procedure Laterality Date  . ABDOMINAL HYSTERECTOMY    . ANKLE SURGERY    . BREAST ENHANCEMENT SURGERY    . CHOLECYSTECTOMY    . TONSILLECTOMY      OB History   No obstetric history on file.      Home Medications    Prior to Admission medications   Medication Sig Start Date End Date Taking? Authorizing Provider  diazepam (VALIUM) 10 MG tablet Take 0.5 tablets by mouth daily as needed. 03/06/16  Yes [provider]  losartan-hydrochlorothiazide (HYZAAR) 50-12.5 MG tablet Take 1 tablet by mouth daily.   Yes [provider]  sertraline (ZOLOFT) 100 MG tablet  05/10/17  Yes [provider]  HYDROcodone-acetaminophen (NORCO/VICODIN) 5-325 MG tablet 1-2 tabs po bid prn 12/30/18   Norval Gable, MD  meloxicam (MOBIC) 15 MG tablet Take 1 tablet (15 mg total) by mouth daily as needed for pain. 06/14/18   Coral Spikes, DO    Family History Family History  Problem Relation Age of Onset  . Hypercholesterolemia Mother   . Hypertension Mother   . Heart  disease Father   . Hypertension Father   . Cancer Father   . Cancer Other     Social History Social History   Tobacco Use  . Smoking status: Never Smoker  . Smokeless tobacco: Never Used  Substance Use Topics  . Alcohol use: Not Currently  . Drug use: Not Currently    Types: Marijuana    Comment: past - last use 3-45 years ago     Allergies   Patient has no known allergies.   Review of Systems Review of Systems   Physical Exam Triage Vital Signs ED Triage Vitals  Enc Vitals Group     BP 12/30/18 1631 131/62     Pulse Rate 12/30/18 1631 92     Resp 12/30/18 1631 18     Temp 12/30/18 1631 98.2 F (36.8 C)     Temp Source 12/30/18 1631 Oral     SpO2 12/30/18 1631 100 %     Weight 12/30/18 1630 215 lb (97.5 kg)     Height 12/30/18 1630 5\' 2"  (1.575 m)     Head Circumference --      Peak Flow --      Pain Score 12/30/18 1630 6     Pain Loc --  Pain Edu? --      Excl. in [place]? --    No data found.  Updated Vital Signs BP 131/62 (BP Location: Right Arm)   Pulse 92   Temp 98.2 F (36.8 C) (Oral)   Resp 18   Ht 5\' 2"  (1.575 m)   Wt 97.5 kg   SpO2 100%   BMI 39.32 kg/m   Visual Acuity Right Eye Distance:   Left Eye Distance:   Bilateral Distance:    Right Eye Near:   Left Eye Near:    Bilateral Near:     Physical Exam Vitals signs and nursing note reviewed.  Constitutional:      General: She is not in acute distress.    Appearance: She is not toxic-appearing or diaphoretic.  Pulmonary:     Effort: Pulmonary effort is normal. No respiratory distress.  Musculoskeletal:     Left shoulder: She exhibits tenderness and bony tenderness. She exhibits normal range of motion, no swelling, no effusion, no crepitus, no deformity, no laceration, no pain, no spasm, normal pulse and normal strength.     Left elbow: She exhibits normal range of motion, no swelling, no effusion, no deformity and no laceration. Tenderness found. Olecranon process tenderness  noted.     Left wrist: She exhibits tenderness and bony tenderness. She exhibits normal range of motion, no swelling, no effusion, no crepitus, no deformity and no laceration.     Comments: Left upper extremity neurovascularly intact  Neurological:     Mental Status: She is alert.      UC Treatments / Results  Labs (all labs ordered are listed, but only abnormal results are displayed) Labs Reviewed - No data to display  EKG   Radiology No results found.  Procedures Procedures (including critical care time)  Medications Ordered in UC Medications - No data to display  Initial Impression / Assessment and Plan / UC Course  I have reviewed the triage vital signs and the nursing notes.  Pertinent labs & imaging results that were available during my care of the patient were reviewed by me and considered in my medical decision making (see chart for details).      Final Clinical Impressions(s) / UC Diagnoses   Final diagnoses:  Fall, initial encounter  Contusion of left wrist, initial encounter  Contusion of left elbow, initial encounter  Contusion of left shoulder, initial encounter     Discharge Instructions     Rest, ice, tylenol/ibuprofen as needed    ED Prescriptions    Medication Sig Dispense Auth. Provider   HYDROcodone-acetaminophen (NORCO/VICODIN) 5-325 MG tablet 1-2 tabs po bid prn 6 tablet Dollie Bressi, Linward Foster, MD     1. x-ray results and diagnosis reviewed with patient 2. rx as per orders above; reviewed possible side effects, interactions, risks and benefits  3. Recommend supportive treatment as above 4. Follow-up prn if symptoms worsen or don't improve  I have reviewed the PDMP during this encounter.   Norval Gable, MD 01/01/19 1718

## 2019-01-24 ENCOUNTER — Other Ambulatory Visit: Payer: Self-pay

## 2019-01-24 DIAGNOSIS — Z20822 Contact with and (suspected) exposure to covid-19: Secondary | ICD-10-CM

## 2019-01-25 LAB — NOVEL CORONAVIRUS, NAA: SARS-CoV-2, NAA: NOT DETECTED

## 2019-03-08 ENCOUNTER — Ambulatory Visit
Admission: EM | Admit: 2019-03-08 | Discharge: 2019-03-08 | Disposition: A | Discharge status: Home / Self Care | Payer: BC Managed Care – PPO | Attending: Family Medicine | Admitting: Family Medicine

## 2019-03-08 ENCOUNTER — Other Ambulatory Visit: Payer: BC Managed Care – PPO

## 2019-03-08 ENCOUNTER — Other Ambulatory Visit: Payer: Self-pay

## 2019-03-08 ENCOUNTER — Encounter: Payer: Self-pay | Admitting: Emergency Medicine

## 2019-03-08 DIAGNOSIS — Z20828 Contact with and (suspected) exposure to other viral communicable diseases: Secondary | ICD-10-CM | POA: Diagnosis not present

## 2019-03-08 DIAGNOSIS — J069 Acute upper respiratory infection, unspecified: Secondary | ICD-10-CM | POA: Diagnosis not present

## 2019-03-08 DIAGNOSIS — Z20822 Contact with and (suspected) exposure to covid-19: Secondary | ICD-10-CM

## 2019-03-08 NOTE — Discharge Instructions (Addendum)
It was very nice seeing you today in clinic. Thank you for entrusting me with your care.   Rest and stay HYDRATED. Water and electrolyte containing beverages (Gatorade, Pedialyte) are best to prevent dehydration and electrolyte abnormalities.  Recommend warm salt water gargles, hard candies/lozenges, and hot tea with honey/lemon to help soothe the throat and reduce irritation.  May use Tylenol and/or Ibuprofen as needed for pain/fever.    You were tested for SARS-CoV-2 (novel coronavirus) today. Testing is performed by an outside lab ([facility]) and has variable turn around times ranging between 2-5 days. Current recommendations from the the CDC and [place] DHHS require that you remain out of work in order to quarantine at home until negative test results are have been received. In the event that your test results are positive, you will be contacted with further directives. These measures are being implemented out of an abundance of caution to prevent transmission and spread during the current SARS-CoV-2 pandemic.  Make arrangements to follow up with your regular doctor in 1 week for re-evaluation if not improving. If your symptoms/condition worsens, please seek follow up care either here or in the ER. Please remember, our [place] providers are "right here with you" when you need [place].   Again, it was my pleasure to take care of you today. Thank you for choosing our clinic. I hope that you start to feel better quickly.   Honor Loh, MSN, APRN, FNP-C, CEN Advanced Practice Provider [facility]

## 2019-03-08 NOTE — ED Triage Notes (Signed)
Patient c/o headache, sore throat, nasal congestion and cough that started yesterday. Denies fever.

## 2019-03-09 LAB — NOVEL CORONAVIRUS, NAA (HOSP ORDER, SEND-OUT TO REF LAB; TAT 18-24 HRS): SARS-CoV-2, NAA: NOT DETECTED

## 2019-03-09 NOTE — ED Provider Notes (Signed)
[place], [place]   Name: Meagan Becker DOB: 1965/09/16 MRN: [id number] CSN: [id number] PCP: Lynnell Jude, MD  Arrival date and time:  03/08/19 [phone]  Chief Complaint:  Headache, Sore Throat, and Cough   NOTE: Prior to seeing the patient today, I have reviewed the triage nursing documentation and vital signs. Clinical staff has updated patient's PMH/PSHx, current medication list, and drug allergies/intolerances to ensure comprehensive history available to assist in medical decision making.   History:   HPI: Meagan Becker is a 53 y.o. female who presents today with complaints of cough, congestion, rhinorrhea, fatigue, sore throat, and a generalized headache that started yesterday. Patient denies fevers.  Cough has been nonproductive.  She denies any shortness of breath or wheezing.  Patient denies that she has experienced any nausea, vomiting, diarrhea, or abdominal pain. She is eating and drinking well. Patient denies any perceived alterations to her sense of taste or smell. Patient presents today with concerns for her personal health after learning that her husband had come into contact with someone who tested positive for SARS-CoV-2 (novel coronavirus).  Patient presents today with her husband who is also being seen with a similar symptom constellation.  Patient also advises that her granddaughter, whom is with her today in clinic, tested positive for the virus in early November and has since recovered. She has not been tested for SARS-CoV-2 (novel coronavirus) in the past 14 days; last tested negative on 01/24/2019 per her report. Patient has been vaccinated for influenza this season. Despite her symptoms, patient has not taken any over the counter interventions to help improve/relieve her reported symptoms at home.   Past Medical History:  Diagnosis Date  . Anxiety disorder   . Anxiety state   . Cellulitis   . Headache   . Hyperlipidemia   . Hypertension   . Menopausal symptom    . Obesity   . Pain, joint, ankle   . Panic disorder   . Retinal vascular changes   . Sprain of neck   . Stricture of ureter   . Upper respiratory infection   . Wheezing     Past Surgical History:  Procedure Laterality Date  . ABDOMINAL HYSTERECTOMY    . ANKLE SURGERY    . BREAST ENHANCEMENT SURGERY    . CHOLECYSTECTOMY    . TONSILLECTOMY      Family History  Problem Relation Age of Onset  . Hypercholesterolemia Mother   . Hypertension Mother   . Heart disease Father   . Hypertension Father   . Cancer Father   . Cancer Other     Social History   Tobacco Use  . Smoking status: Never Smoker  . Smokeless tobacco: Never Used  Substance Use Topics  . Alcohol use: Not Currently  . Drug use: Not Currently    Types: Marijuana    Comment: past - last use 3-4 years ago    Patient Active Problem List   Diagnosis Date Noted  . Essential hypertension 02/20/2013  . Chest discomfort 02/20/2013    Home Medications:    Current Meds  Medication Sig  . diazepam (VALIUM) 10 MG tablet Take 0.5 tablets by mouth daily as needed.  Marland Kitchen losartan-hydrochlorothiazide (HYZAAR) 50-12.5 MG tablet Take 1 tablet by mouth daily.    Allergies:   Patient has no known allergies.  Review of Systems (ROS): Review of Systems  Constitutional: Positive for chills and fatigue. Negative for fever.  HENT: Positive for congestion and rhinorrhea. Negative for ear  pain, postnasal drip, sinus pressure, sinus pain, sneezing and sore throat.   Eyes: Negative for pain, discharge and redness.  Respiratory: Positive for cough. Negative for chest tightness and shortness of breath.   Cardiovascular: Negative for chest pain and palpitations.  Gastrointestinal: Negative for abdominal pain, diarrhea, nausea and vomiting.  Musculoskeletal: Negative for arthralgias, back pain, myalgias and neck pain.  Skin: Negative for color change, pallor and rash.  Neurological: Positive for headaches. Negative for  dizziness, syncope and weakness.  Hematological: Negative for adenopathy.     Vital Signs: Today's Vitals   03/08/19 0946 03/08/19 0948 03/08/19 0949 03/08/19 1050  BP:   (!) 134/93   Pulse:   82   Resp:   18   Temp:   98.2 F (36.8 C)   SpO2:   99%   Weight:  215 lb (97.5 kg)    Height:  5\' 4"  (1.626 m)    PainSc: 4    4     Physical Exam: Physical Exam  Constitutional: She is oriented to person, place, and time and well-developed, well-nourished, and in no distress.  HENT:  Head: Normocephalic and atraumatic.  Right Ear: Tympanic membrane normal.  Left Ear: Tympanic membrane normal.  Nose: Rhinorrhea present. No sinus tenderness.  Mouth/Throat: Uvula is midline and mucous membranes are normal. Posterior oropharyngeal erythema (mild) present. No oropharyngeal exudate or posterior oropharyngeal edema.  Eyes: Pupils are equal, round, and reactive to light.  Cardiovascular: Normal rate, regular rhythm, normal heart sounds and intact distal pulses.  Pulmonary/Chest: Effort normal and breath sounds normal.  Slight cough in clinic.  No increased WOB; not visibly SOB. SPO2 99% on RA.   Musculoskeletal:     Cervical back: Normal range of motion and neck supple.  Lymphadenopathy:       Head (right side): Submandibular adenopathy present.  Neurological: She is alert and oriented to person, place, and time. Gait normal.  Skin: Skin is warm and dry. No rash noted. She is not diaphoretic.  Psychiatric: Mood, memory, affect and judgment normal.  Nursing note and vitals reviewed.   Urgent Care Treatments / Results:   Orders Placed This Encounter  Procedures  . Novel Coronavirus, NAA (Hosp order, Send-out to Ref Lab; TAT 18-24 hrs    LABS: PLEASE NOTE: all labs that were ordered this encounter are listed, however only abnormal results are displayed. Labs Reviewed  NOVEL CORONAVIRUS, NAA (HOSP ORDER, SEND-OUT TO REF LAB; TAT 18-24 HRS)    EKG: -None  RADIOLOGY: No results  found.  PROCEDURES: Procedures  MEDICATIONS RECEIVED THIS VISIT: Medications - No data to display  PERTINENT CLINICAL COURSE NOTES/UPDATES:   Initial Impression / Assessment and Plan / Urgent Care Course:  Pertinent labs & imaging results that were available during my care of the patient were personally reviewed by me and considered in my medical decision making (see lab/imaging section of note for values and interpretations).  Meagan Becker is a 53 y.o. female who presents to [facility] today with complaints of Headache, Sore Throat, and Cough   Patient overall well appearing and in no acute distress today in clinic. Presenting symptoms (see HPI) and exam as documented above. She presents with symptoms associated with SARS-CoV-2 (novel coronavirus) following an indirect exposure.  Husband also presents with similar symptoms today.  Discussed typical symptom constellation. Reviewed potential for infection and need for testing. Patient amenable to being tested. SARS-CoV-2 swab collected by certified clinical staff. Discussed variable turn around times associated  with testing, as swabs are being processed at [facility], and have been taking between 2-5 days to come back. She was advised to self quarantine, per [facility] DHHS guidelines, until negative results received. These measures are being implemented out of an abundance of caution to prevent transmission and spread during the current SARS-CoV-2 pandemic.  Presenting symptoms consistent with acute viral illness. Until ruled out with confirmatory lab testing, SARS-CoV-2 remains part of the differential. Her testing is pending at this time. I discussed with her that her symptoms are felt to be viral in nature, thus antibiotics would not offer her any relief or improve his symptoms any faster than conservative symptomatic management.  Intervention offered for cough, however patient declined. Discussed supportive care measures at home during acute  phase of illness. Patient to rest as much as possible. She was encouraged to ensure adequate hydration (water and ORS) to prevent dehydration and electrolyte derangements. Recommended warm salt water gargles, hard candies/lozenges, and hot tea with honey/lemon to help soothe the throat and reduce irritation. May use Tylenol and/or Ibuprofen as needed for pain/fever.    Discussed follow up with primary care physician in 1 week for re-evaluation. I have reviewed the follow up and strict return precautions for any new or worsening symptoms. Patient is aware of symptoms that would be deemed urgent/emergent, and would thus require further evaluation either here or in the emergency department. At the time of discharge, she verbalized understanding and consent with the discharge plan as it was reviewed with her. All questions were fielded by provider and/or clinic staff prior to patient discharge.    Final Clinical Impressions / Urgent Care Diagnoses:   Final diagnoses:  Viral URI with cough  Exposure to COVID-19 virus  Encounter for laboratory testing for COVID-19 virus    New Prescriptions:  [place] Controlled Substance Registry consulted? Not Applicable  No orders of the defined types were placed in this encounter.   Recommended Follow up Care:  Patient encouraged to follow up with the following provider within the specified time frame, or sooner as dictated by the severity of her symptoms. As always, she was instructed that for any urgent/emergent care needs, she should seek care either here or in the emergency department for more immediate evaluation.  Follow-up Information    Lynnell Jude, MD In 1 week.   Specialty: Family Medicine Why: General reassessment of symptoms if not improving Contact information: [place] [id number] [phone]         NOTE: This note was prepared using Dragon dictation software along with smaller phrase technology. Despite my best ability to  proofread, there is the potential that transcriptional errors may still occur from this process, and are completely unintentional.    Karen Kitchens, NP 03/09/19 [phone]

## 2019-03-12 ENCOUNTER — Other Ambulatory Visit: Payer: Self-pay

## 2019-03-12 ENCOUNTER — Encounter: Payer: Self-pay | Admitting: Emergency Medicine

## 2019-03-12 ENCOUNTER — Ambulatory Visit
Admission: EM | Admit: 2019-03-12 | Discharge: 2019-03-12 | Disposition: A | Discharge status: Home / Self Care | Payer: BC Managed Care – PPO | Attending: Internal Medicine | Admitting: Internal Medicine

## 2019-03-12 DIAGNOSIS — Z20828 Contact with and (suspected) exposure to other viral communicable diseases: Secondary | ICD-10-CM | POA: Diagnosis present

## 2019-03-12 DIAGNOSIS — U071 COVID-19: Secondary | ICD-10-CM | POA: Diagnosis not present

## 2019-03-12 DIAGNOSIS — Z8616 Personal history of COVID-19: Secondary | ICD-10-CM

## 2019-03-12 DIAGNOSIS — J029 Acute pharyngitis, unspecified: Secondary | ICD-10-CM | POA: Diagnosis present

## 2019-03-12 DIAGNOSIS — Z20822 Contact with and (suspected) exposure to covid-19: Secondary | ICD-10-CM

## 2019-03-12 HISTORY — DX: Personal history of COVID-19: Z86.16

## 2019-03-12 LAB — GROUP A STREP BY PCR: Group A Strep by PCR: NOT DETECTED

## 2019-03-12 LAB — SARS CORONAVIRUS 2 AG (30 MIN TAT): SARS Coronavirus 2 Ag: NEGATIVE

## 2019-03-12 MED ORDER — IBUPROFEN 400 MG PO TABS: 400.0000 mg | ORAL_TABLET | Freq: Four times a day (QID) | ORAL | 0 refills | Status: DC | PRN

## 2019-03-12 MED ORDER — BENZONATATE 100 MG PO CAPS: 100.0000 mg | ORAL_CAPSULE | Freq: Three times a day (TID) | ORAL | 0 refills | Status: DC

## 2019-03-12 NOTE — Discharge Instructions (Signed)
Please isolate until covid 19 PCR results are available. Continue face masking, respiratory/hand etiquette and social distancing

## 2019-03-12 NOTE — ED Provider Notes (Signed)
[facility]    CSN: [id number] Arrival date & time: 03/12/19  0906      History   Chief Complaint Chief Complaint  Patient presents with  . Sore Throat  . Cough    HPI Meagan Becker is a 53 y.o. female with a history of hypertension-controlled comes to urgent care with complaints of sore throat, cough and nasal congestion of about 5 days duration.  Patient says that her symptoms have worsened over the past several days.  She has subjective fever.  No generalized body aches.  No shortness of breath.  Patient has been was diagnosed with COVID-19 infection on 03/08/2019.  Patient denies any nausea, vomiting or diarrhea.   HPI  Past Medical History:  Diagnosis Date  . Anxiety disorder   . Anxiety state   . Cellulitis   . Headache   . Hyperlipidemia   . Hypertension   . Menopausal symptom   . Obesity   . Pain, joint, ankle   . Panic disorder   . Retinal vascular changes   . Sprain of neck   . Stricture of ureter   . Upper respiratory infection   . Wheezing     Patient Active Problem List   Diagnosis Date Noted  . Essential hypertension 02/20/2013  . Chest discomfort 02/20/2013    Past Surgical History:  Procedure Laterality Date  . ABDOMINAL HYSTERECTOMY    . ANKLE SURGERY    . BREAST ENHANCEMENT SURGERY    . CHOLECYSTECTOMY    . TONSILLECTOMY      OB History   No obstetric history on file.      Home Medications    Prior to Admission medications   Medication Sig Start Date End Date Taking? Authorizing Provider  diazepam (VALIUM) 10 MG tablet Take 0.5 tablets by mouth daily as needed. 03/06/16  Yes [provider]  losartan-hydrochlorothiazide (HYZAAR) 50-12.5 MG tablet Take 1 tablet by mouth daily.   Yes [provider]  benzonatate (TESSALON) 100 MG capsule Take 1 capsule (100 mg total) by mouth every 8 (eight) hours. 03/12/19   Chase Picket, MD  ibuprofen (ADVIL) 400 MG tablet Take 1 tablet (400 mg total) by  mouth every 6 (six) hours as needed. 03/12/19   LampteyMyrene Galas, MD    Family History Family History  Problem Relation Age of Onset  . Hypercholesterolemia Mother   . Hypertension Mother   . Heart disease Father   . Hypertension Father   . Cancer Father   . Cancer Other     Social History Social History   Tobacco Use  . Smoking status: Never Smoker  . Smokeless tobacco: Never Used  Substance Use Topics  . Alcohol use: Not Currently  . Drug use: Not Currently    Types: Marijuana    Comment: past - last use 3-4 years ago     Allergies   Patient has no known allergies.   Review of Systems Review of Systems  Constitutional: Positive for activity change, fatigue and fever. Negative for chills.  HENT: Positive for congestion. Negative for postnasal drip, sinus pressure and sinus pain.   Eyes: Negative for photophobia, discharge, redness and itching.  Respiratory: Negative.  Negative for apnea and shortness of breath.   Cardiovascular: Negative.   Gastrointestinal: Negative for abdominal pain, nausea and vomiting.  Genitourinary: Negative.  Negative for dysuria and urgency.  Musculoskeletal: Negative for arthralgias and myalgias.  Skin: Negative.   Neurological: Negative.  Negative for dizziness,  weakness and headaches.  Psychiatric/Behavioral: Negative for confusion and decreased concentration.     Physical Exam Triage Vital Signs ED Triage Vitals  Enc Vitals Group     BP 03/12/19 0918 134/88     Pulse Rate 03/12/19 0918 (!) 102     Resp 03/12/19 0918 16     Temp 03/12/19 0918 99.3 F (37.4 C)     Temp Source 03/12/19 0918 Oral     SpO2 03/12/19 0918 98 %     Weight 03/12/19 0913 215 lb (97.5 kg)     Height 03/12/19 0913 5\' 3"  (1.6 m)     Head Circumference --      Peak Flow --      Pain Score 03/12/19 0913 6     Pain Loc --      Pain Edu? --      Excl. in [place]? --    No data found.  Updated Vital Signs BP 134/88 (BP Location: Right Arm)   Pulse (!)  102   Temp 99.3 F (37.4 C) (Oral)   Resp 16   Ht 5\' 3"  (1.6 m)   Wt 97.5 kg   SpO2 98%   BMI 38.09 kg/m   Visual Acuity Right Eye Distance:   Left Eye Distance:   Bilateral Distance:    Right Eye Near:   Left Eye Near:    Bilateral Near:     Physical Exam   UC Treatments / Results  Labs (all labs ordered are listed, but only abnormal results are displayed) Labs Reviewed  SARS CORONAVIRUS 2 AG (30 MIN TAT)  GROUP A STREP BY PCR  NOVEL CORONAVIRUS, NAA (HOSP ORDER, SEND-OUT TO REF LAB; TAT 18-24 HRS)    EKG   Radiology No results found.  Procedures Procedures (including critical care time)  Medications Ordered in UC Medications - No data to display  Initial Impression / Assessment and Plan / UC Course  I have reviewed the triage vital signs and the nursing notes.  Pertinent labs & imaging results that were available during my care of the patient were reviewed by me and considered in my medical decision making (see chart for details).     1.  Viral pharyngitis in the setting of close exposure to COVID-19: COVID-19 PCR test sent out Rapid Covid test is negative Tessalon Perles as needed for cough Ibuprofen as needed for pain If patient symptoms worsen she is advised to return to urgent care to be reevaluated Patient is advised to isolate until COVID-19 test results are available  Final Clinical Impressions(s) / UC Diagnoses   Final diagnoses:  Viral pharyngitis  Close exposure to COVID-19 virus     Discharge Instructions     Please isolate until covid 19 PCR results are available. Continue face masking, respiratory/hand etiquette and social distancing   ED Prescriptions    Medication Sig Dispense Auth. Provider   benzonatate (TESSALON) 100 MG capsule Take 1 capsule (100 mg total) by mouth every 8 (eight) hours. 21 capsule Tawna Alwin, Myrene Galas, MD   ibuprofen (ADVIL) 400 MG tablet Take 1 tablet (400 mg total) by mouth every 6 (six) hours as needed.  30 tablet Oyindamola Key, Myrene Galas, MD     PDMP not reviewed this encounter.   Chase Picket, MD 03/12/19 1101

## 2019-03-12 NOTE — ED Triage Notes (Addendum)
Patient c/o sore throat, cough, and nasal congestion that started on 03/07/19.  Patient states that her symptoms have gotten worse.  Patient reports low grade fevers.  Patient was seen on 03/08/19 and her COVID test was Negative. Patient states that her husband tested positive for COVID on 03/08/19.

## 2019-03-14 ENCOUNTER — Telehealth: Payer: Self-pay | Admitting: Emergency Medicine

## 2019-03-14 NOTE — Telephone Encounter (Signed)
Patient contacted regarding her COVID test.  Your test for COVID-19 was positive, meaning that you were infected with the novel coronavirus and could give the germ to others. Please continue isolation at home, for at least 10 days since the start of your fever/cough/breathlessness and until you have had 3 consecutive days without fever (without taking a fever reducer) and with cough/breathlessness improving. Please continue good preventive care measures, including: frequent hand-washing, avoid touching your face, cover coughs/sneezes, stay out of crowds and keep a 6 foot distance from others. Recheck or go to the nearest hospital ED tent for re-assessment if fever/cough/breathlessness return.   Patient contacted and made aware of all results, all questions answered.

## 2019-03-15 ENCOUNTER — Encounter: Payer: Self-pay | Admitting: Infectious Diseases

## 2019-03-15 NOTE — Progress Notes (Signed)
Patient does not meet criteria for monoclonal antibody treatment for COVID+ outpatients due to symptom duration > 10 days by the next infusion appointment.

## 2019-03-27 LAB — NOVEL CORONAVIRUS, NAA (HOSP ORDER, SEND-OUT TO REF LAB; TAT 18-24 HRS): SARS-CoV-2, NAA: DETECTED — AB

## 2019-06-24 ENCOUNTER — Ambulatory Visit: Payer: BC Managed Care – PPO | Attending: Internal Medicine

## 2019-06-24 DIAGNOSIS — Z23 Encounter for immunization: Secondary | ICD-10-CM

## 2019-06-24 NOTE — Progress Notes (Signed)
   Covid-19 Vaccination Clinic  Name:  Meagan Becker    MRN: [id number] DOB: [birth year]  06/24/2019  [name] was observed post Covid-19 immunization for 15 minutes without incident. She was provided with Vaccine Information Sheet and instruction to access the V-Safe system.   [name] was instructed to call 911 with any severe reactions post vaccine: Marland Kitchen Difficulty breathing  . Swelling of face and throat  . A fast heartbeat  . A bad rash all over body  . Dizziness and weakness   Immunizations Administered    Name Date Dose VIS Date Route   Pfizer COVID-19 Vaccine 06/24/2019  1:43 PM 0.3 mL 02/24/2019 Intramuscular   Manufacturer: [place]   Lot: [id number]   [place]: [id number]

## 2019-07-19 ENCOUNTER — Ambulatory Visit: Payer: BC Managed Care – PPO | Attending: Internal Medicine

## 2019-07-19 DIAGNOSIS — Z23 Encounter for immunization: Secondary | ICD-10-CM

## 2019-07-19 NOTE — Progress Notes (Signed)
   Covid-19 Vaccination Clinic  Name:  Meagan Becker    MRN: [id number] DOB: 1965-04-22  07/19/2019  [name] was observed post Covid-19 immunization for 15 minutes without incident. She was provided with Vaccine Information Sheet and instruction to access the V-Safe system.   [name] was instructed to call 911 with any severe reactions post vaccine: Marland Kitchen Difficulty breathing  . Swelling of face and throat  . A fast heartbeat  . A bad rash all over body  . Dizziness and weakness   Immunizations Administered    Name Date Dose VIS Date Route   Pfizer COVID-19 Vaccine 07/19/2019  3:18 PM 0.3 mL 05/10/2018 Intramuscular   Manufacturer: [place]   Lot: [id number]   [place]: [id number]

## 2019-08-10 ENCOUNTER — Emergency Department: Payer: BC Managed Care – PPO

## 2019-08-10 ENCOUNTER — Emergency Department
Admission: EM | Admit: 2019-08-10 | Discharge: 2019-08-10 | Disposition: A | Discharge status: Home / Self Care | Payer: BC Managed Care – PPO | Attending: Emergency Medicine | Admitting: Emergency Medicine

## 2019-08-10 ENCOUNTER — Other Ambulatory Visit: Payer: Self-pay

## 2019-08-10 ENCOUNTER — Ambulatory Visit (INDEPENDENT_AMBULATORY_CARE_PROVIDER_SITE_OTHER)
Admission: EM | Admit: 2019-08-10 | Discharge: 2019-08-10 | Disposition: A | Discharge status: Home / Self Care | Payer: BC Managed Care – PPO | Source: Home / Self Care

## 2019-08-10 ENCOUNTER — Encounter: Payer: Self-pay | Admitting: Emergency Medicine

## 2019-08-10 DIAGNOSIS — R000 Tachycardia, unspecified: Secondary | ICD-10-CM

## 2019-08-10 DIAGNOSIS — R7989 Other specified abnormal findings of blood chemistry: Secondary | ICD-10-CM | POA: Insufficient documentation

## 2019-08-10 DIAGNOSIS — I10 Essential (primary) hypertension: Secondary | ICD-10-CM | POA: Diagnosis not present

## 2019-08-10 DIAGNOSIS — R002 Palpitations: Secondary | ICD-10-CM

## 2019-08-10 DIAGNOSIS — F411 Generalized anxiety disorder: Secondary | ICD-10-CM

## 2019-08-10 DIAGNOSIS — R202 Paresthesia of skin: Secondary | ICD-10-CM

## 2019-08-10 DIAGNOSIS — Z79899 Other long term (current) drug therapy: Secondary | ICD-10-CM | POA: Insufficient documentation

## 2019-08-10 LAB — FIBRIN DERIVATIVES D-DIMER (ARMC ONLY): Fibrin derivatives D-dimer (ARMC): 805.54 ng/mL (FEU) — ABNORMAL HIGH (ref 0.00–499.00)

## 2019-08-10 LAB — CBC
HCT: 40.1 % (ref 36.0–46.0)
Hemoglobin: 13.5 g/dL (ref 12.0–15.0)
MCH: 29.6 pg (ref 26.0–34.0)
MCHC: 33.7 g/dL (ref 30.0–36.0)
MCV: 87.9 fL (ref 80.0–100.0)
Platelets: 427 10*3/uL — ABNORMAL HIGH (ref 150–400)
RBC: 4.56 MIL/uL (ref 3.87–5.11)
RDW: 12.3 % (ref 11.5–15.5)
WBC: 9.9 10*3/uL (ref 4.0–10.5)
nRBC: 0 % (ref 0.0–0.2)

## 2019-08-10 LAB — BASIC METABOLIC PANEL
Anion gap: 10 (ref 5–15)
Anion gap: 9 (ref 5–15)
BUN: 17 mg/dL (ref 6–20)
BUN: 17 mg/dL (ref 6–20)
CO2: 27 mmol/L (ref 22–32)
CO2: 27 mmol/L (ref 22–32)
Calcium: 10.1 mg/dL (ref 8.9–10.3)
Calcium: 10.1 mg/dL (ref 8.9–10.3)
Chloride: 101 mmol/L (ref 98–111)
Chloride: 102 mmol/L (ref 98–111)
Creatinine, Ser: 0.92 mg/dL (ref 0.44–1.00)
Creatinine, Ser: 0.9 mg/dL (ref 0.44–1.00)
GFR calc Af Amer: >60 mL/min (ref >60)
GFR calc Af Amer: >60 mL/min (ref >60)
GFR calc non Af Amer: >60 mL/min (ref >60)
GFR calc non Af Amer: >60 mL/min (ref >60)
Glucose, Bld: 111 mg/dL — ABNORMAL HIGH (ref 70–99)
Glucose, Bld: 120 mg/dL — ABNORMAL HIGH (ref 70–99)
Potassium: 3.6 mmol/L (ref 3.5–5.1)
Potassium: 3.7 mmol/L (ref 3.5–5.1)
Sodium: 138 mmol/L (ref 135–145)
Sodium: 138 mmol/L (ref 135–145)

## 2019-08-10 LAB — CBC WITH DIFFERENTIAL/PLATELET
Abs Immature Granulocytes: 0.02 10*3/uL (ref 0.00–0.07)
Basophils Absolute: 0.1 10*3/uL (ref 0.0–0.1)
Basophils Relative: 1 %
Eosinophils Absolute: 0.1 10*3/uL (ref 0.0–0.5)
Eosinophils Relative: 2 %
HCT: 41.5 % (ref 36.0–46.0)
Hemoglobin: 14.1 g/dL (ref 12.0–15.0)
Immature Granulocytes: 0 %
Lymphocytes Relative: 37 %
Lymphs Abs: 3.5 10*3/uL (ref 0.7–4.0)
MCH: 29.7 pg (ref 26.0–34.0)
MCHC: 34 g/dL (ref 30.0–36.0)
MCV: 87.4 fL (ref 80.0–100.0)
Monocytes Absolute: 0.5 10*3/uL (ref 0.1–1.0)
Monocytes Relative: 6 %
Neutro Abs: 5 10*3/uL (ref 1.7–7.7)
Neutrophils Relative %: 54 %
Platelets: 447 10*3/uL — ABNORMAL HIGH (ref 150–400)
RBC: 4.75 MIL/uL (ref 3.87–5.11)
RDW: 12.5 % (ref 11.5–15.5)
WBC: 9.3 10*3/uL (ref 4.0–10.5)
nRBC: 0 % (ref 0.0–0.2)

## 2019-08-10 LAB — TROPONIN I (HIGH SENSITIVITY)
Troponin I (High Sensitivity): 2 ng/L (ref <18)
Troponin I (High Sensitivity): 3 ng/L (ref <18)

## 2019-08-10 MED ORDER — LORAZEPAM 2 MG/ML IJ SOLN
1.0000 mg | Freq: Once | INTRAMUSCULAR | Status: AC
  Administered 2019-08-10: 1 mg via INTRAVENOUS
  Filled 2019-08-10: qty 1

## 2019-08-10 MED ORDER — IOHEXOL 350 MG/ML SOLN
75.0000 mL | Freq: Once | INTRAVENOUS | Status: AC | PRN
  Administered 2019-08-10: 75 mL via INTRAVENOUS
  Filled 2019-08-10: qty 75

## 2019-08-10 NOTE — ED Triage Notes (Signed)
Pt reports has some sharp scratch like pain in her chest. Pt reports the sharp sensation radiated into her right jaw and the right side of her face and she felt like her heart was beating irregular. Pt denies SOB.

## 2019-08-10 NOTE — ED Triage Notes (Signed)
Pt in via EMS from [place] for c/o a scraping sensation on her right chest and right side of face. Sx's started at 13:30. No other concerns or complaints. Negative 12-lead, VS stable. Pt has a lot of anxiety. 112/67, FSBS 124, afebrile, #20g to left [facility]

## 2019-08-10 NOTE — ED Provider Notes (Signed)
[facility] Emergency Department Provider Note  ____________________________________________   First MD Initiated Contact with Patient 08/10/19 1943     (approximate)  I have reviewed the triage vital signs and the nursing notes.   HISTORY  Chief Complaint Chest Pain, Numbness, and Nausea    HPI Meagan Becker is a 54 y.o. female    54 yo F here with palpitations, chest pain, facial numbness. Pt arrives from urgent care. She reports that while at work today, she began to feel a "fluttering" feeling in her right chest. She was not doing anything particularly strenuous with onset. She then felt like it went up into her right neck and to her right face along with a tingling/numbness sensation in her right face. She felt SOB with this. No nausea, vomiting. No diaphoresis. She then became increasingly anxious about the possible causes, causing her to experience more SOB and anxiety. She went to [facility] and had basic labs/imaging done, including D-Dimer. Labs were reassuring but D-Dimer was found to be positive, so she was sent to the ED for evaluation.   Pt states she has continued to have intermittent fluttering sensation in her right upper chest area extending up to her neck, along with some occasional discomfort. No overt pain, however. No pleuritic component. No cough or hemoptysis.        Past Medical History:  Diagnosis Date  . Anxiety disorder   . Anxiety state   . Cellulitis   . Headache   . History of 2019 novel coronavirus disease (COVID-19) 03/12/2019  . Hyperlipidemia   . Hypertension   . Menopausal symptom   . Obesity   . Pain, joint, ankle   . Panic disorder   . Retinal vascular changes   . Sprain of neck   . Stricture of ureter   . Upper respiratory infection   . Wheezing     Patient Active Problem List   Diagnosis Date Noted  . Essential hypertension 02/20/2013  . Chest discomfort 02/20/2013    Past Surgical History:  Procedure  Laterality Date  . ABDOMINAL HYSTERECTOMY    . ANKLE SURGERY    . BREAST ENHANCEMENT SURGERY    . CHOLECYSTECTOMY    . TONSILLECTOMY      Prior to Admission medications   Medication Sig Start Date End Date Taking? Authorizing Provider  diazepam (VALIUM) 10 MG tablet Take 0.5 tablets by mouth daily as needed. 03/06/16   [provider]  losartan-hydrochlorothiazide (HYZAAR) 50-12.5 MG tablet Take 1 tablet by mouth daily.    [provider]    Allergies Patient has no known allergies.  Family History  Problem Relation Age of Onset  . Hypercholesterolemia Mother   . Hypertension Mother   . Heart disease Father   . Hypertension Father   . Cancer Father   . Cancer Other     Social History Social History   Tobacco Use  . Smoking status: Never Smoker  . Smokeless tobacco: Never Used  Substance Use Topics  . Alcohol use: Not Currently  . Drug use: Not Currently    Types: Marijuana    Comment: past - last use 3-4 years ago    Review of Systems  Review of Systems  Constitutional: Positive for fatigue. Negative for fever.  HENT: Negative for congestion and sore throat.   Eyes: Negative for visual disturbance.  Respiratory: Negative for cough and shortness of breath.   Cardiovascular: Positive for chest pain and palpitations.  Gastrointestinal: Negative  for abdominal pain, diarrhea, nausea and vomiting.  Genitourinary: Negative for flank pain.  Musculoskeletal: Negative for back pain and neck pain.  Skin: Negative for rash and wound.  Neurological: Negative for weakness.  Psychiatric/Behavioral: The patient is nervous/anxious.   All other systems reviewed and are negative.    ____________________________________________  PHYSICAL EXAM:      VITAL SIGNS: ED Triage Vitals  Enc Vitals Group     BP 08/10/19 1649 (!) 145/83     Pulse Rate 08/10/19 1649 98     Resp 08/10/19 1649 18     Temp 08/10/19 1649 98.6 F (37 C)     Temp Source 08/10/19 1649  Oral     SpO2 08/10/19 1649 99 %     Weight 08/10/19 1651 220 lb (99.8 kg)     Height 08/10/19 1651 5\' 3"  (1.6 m)     Head Circumference --      Peak Flow --      Pain Score 08/10/19 1654 4     Pain Loc --      Pain Edu? --      Excl. in [place]? --      Physical Exam Vitals and nursing note reviewed.  Constitutional:      General: She is not in acute distress.    Appearance: She is well-developed.  HENT:     Head: Normocephalic and atraumatic.  Eyes:     Conjunctiva/sclera: Conjunctivae normal.  Neck:     Comments: No bruit, no JVD. Cardiovascular:     Rate and Rhythm: Normal rate and regular rhythm.     Heart sounds: Normal heart sounds. No murmur. No friction rub.  Pulmonary:     Effort: Pulmonary effort is normal. No respiratory distress.     Breath sounds: Normal breath sounds. No wheezing or rales.  Abdominal:     General: There is no distension.     Palpations: Abdomen is soft.     Tenderness: There is no abdominal tenderness.  Musculoskeletal:     Cervical back: Neck supple.  Skin:    General: Skin is warm.     Capillary Refill: Capillary refill takes less than 2 seconds.  Neurological:     Mental Status: She is alert and oriented to person, place, and time.     Motor: No abnormal muscle tone.  Psychiatric:        Mood and Affect: Mood is anxious.       ____________________________________________   LABS (all labs ordered are listed, but only abnormal results are displayed)  Labs Reviewed  BASIC METABOLIC PANEL - Abnormal; Notable for the following components:      Result Value   Glucose, Bld 120 (*)    All other components within normal limits  CBC - Abnormal; Notable for the following components:   Platelets 427 (*)    All other components within normal limits  POC URINE PREG, ED  TROPONIN I (HIGH SENSITIVITY)    ____________________________________________  EKG: Sinus tachycardia, VR 102. PR 130, QRS 78, QTc 419. No acute ST elevation or  depression. No ischemia or infarct. ________________________________________  RADIOLOGY All imaging, including plain films, CT scans, and ultrasounds, independently reviewed by me, and interpretations confirmed via formal radiology reads.  ED MD interpretation:   CXR: Clear CT Angio: No PE, diffuse hepatic steatosis CT Head: [place]  Official radiology report(s): DG Chest 2 View  Result Date: 08/10/2019 CLINICAL DATA:  Right-sided chest pain for 1 hour. History of COVID 19.  EXAM: CHEST - 2 VIEW COMPARISON:  01/24/2018 FINDINGS: Midline trachea. Normal heart size and mediastinal contours. No pleural effusion or pneumothorax. Clear lungs. Cholecystectomy clips. IMPRESSION: No acute cardiopulmonary disease. Electronically Signed   By: Abigail Miyamoto M.D.   On: 08/10/2019 17:49   CT Head Wo Contrast  Result Date: 08/10/2019 CLINICAL DATA:  Headache, right facial numbness. EXAM: CT HEAD WITHOUT CONTRAST TECHNIQUE: Contiguous axial images were obtained from the base of the skull through the vertex without intravenous contrast. COMPARISON:  None. FINDINGS: Brain: No evidence of acute infarction, hemorrhage, hydrocephalus, extra-axial collection or mass lesion/mass effect. Vascular: No hyperdense vessel or unexpected calcification. Skull: Normal. Negative for fracture or focal lesion. Sinuses/Orbits: No acute finding. Other: None. IMPRESSION: Normal head CT. Electronically Signed   By: Marijo Conception M.D.   On: 08/10/2019 20:21   CT Angio Chest PE W and/or Wo Contrast  Result Date: 08/10/2019 CLINICAL DATA:  Short of breath, anxiety, arrhythmia EXAM: CT ANGIOGRAPHY CHEST WITH CONTRAST TECHNIQUE: Multidetector CT imaging of the chest was performed using the standard protocol during bolus administration of intravenous contrast. Multiplanar CT image reconstructions and MIPs were obtained to evaluate the vascular anatomy. CONTRAST:  18mL OMNIPAQUE IOHEXOL 350 MG/ML SOLN COMPARISON:  08/10/2019 FINDINGS:  Cardiovascular: This is a technically adequate evaluation of the pulmonary vasculature. No filling defects or pulmonary emboli. The heart is unremarkable without pericardial effusion. Thoracic aorta is normal without aneurysm or dissection. Mediastinum/Nodes: No enlarged mediastinal, hilar, or axillary lymph nodes. Thyroid gland, trachea, and esophagus demonstrate no significant findings. Lungs/Pleura: No airspace disease, effusion, or pneumothorax. Central airways are patent. Upper Abdomen: Diffuse hepatic steatosis. Musculoskeletal: No acute or destructive bony lesions. Reconstructed images demonstrate no additional findings. Review of the MIP images confirms the above findings. IMPRESSION: 1. No CT evidence of pulmonary embolism. 2. Diffuse hepatic steatosis. Electronically Signed   By: Randa Ngo M.D.   On: 08/10/2019 20:18    ____________________________________________  PROCEDURES   Procedure(s) performed (including Critical Care):  Procedures  ____________________________________________  INITIAL IMPRESSION / MDM / [place] / ED COURSE  As part of my medical decision making, I reviewed the following data within the [place] notes reviewed and incorporated, Old chart reviewed, Notes from prior ED visits, and [place] Controlled Substance Database       *IDESSA NIRO was evaluated in Emergency Department on 08/10/2019 for the symptoms described in the history of present illness. She was evaluated in the context of the global COVID-19 pandemic, which necessitated consideration that the patient might be at risk for infection with the SARS-CoV-2 virus that causes COVID-19. Institutional protocols and algorithms that pertain to the evaluation of patients at risk for COVID-19 are in a state of rapid change based on information released by regulatory bodies including the CDC and federal and state organizations. These policies and algorithms were followed  during the patient's care in the ED.  Some ED evaluations and interventions may be delayed as a result of limited staffing during the pandemic.*     Medical Decision Making:  Very pleasant 54 yo F here with fluttering/palpitations and mild chest pain. Reviewed labs, CXR from urgent care which are overall reassuring with exception of slight D-Dimer elevation. Unlikely PE based on exam but given her CP/tachycardia (though resolved here), CT angio obtained and is negative. Given her reported numbness sensation, CT Head obtained as well and is negative. Repeat trop negative. Labs otherwise reassuring.  Differential includes symptomatic palpitations/PVCs vs  transient arrhythmia, versus anxiety and panic attack. No signs of ongoing arrhythmia here. She is otherwise stable. Will have her call her PCP for further work-up, possible Holter monitoring but based on reassuring imaging, labs, neg trop, normal EKG, doubt ACS, PE, dissection, or other acute emergent pathology.  ____________________________________________  FINAL CLINICAL IMPRESSION(S) / ED DIAGNOSES  Final diagnoses:  Paresthesia  Palpitations     MEDICATIONS GIVEN DURING THIS VISIT:  Medications  iohexol (OMNIPAQUE) 350 MG/ML injection 75 mL (75 mLs Intravenous Contrast Given 08/10/19 1956)  LORazepam (ATIVAN) injection 1 mg (1 mg Intravenous Given 08/10/19 2100)     ED Discharge Orders    None       Note:  This document was prepared using Dragon voice recognition software and may include unintentional dictation errors.   Duffy Bruce, MD 08/10/19 2220

## 2019-08-10 NOTE — ED Provider Notes (Signed)
[place], [place]   Name: Meagan Becker DOB: [birth year] MRN: [id number] CSN: [id number] PCP: Meagan Jude, MD  Arrival date and time:  08/10/19 1424  Chief Complaint:  Chest Pain and Numbness  NOTE: Prior to seeing the patient today, I have reviewed the triage nursing documentation and vital signs. Clinical staff has updated patient's PMH/PSHx, current medication list, and drug allergies/intolerances to ensure comprehensive history available to assist in medical decision making.   History:   HPI: Meagan Becker is a 54 y.o. female who presents today with complaints of "feeling weird" on the RIGHT side of her body. Patient reports that while at work she began feeling "fluttering" in the RIGHT side of her chest. She was not doing anything strenuous at the time of onset. She denies associated chest pain and shortness of breath. Patient reports that since her symptoms began she has started to "feel numb" on virtually her entire RIGHT side. Numbness extends into her neck, RIGHT side of her face, and into her head.  She denies headache, areas of focal weakness, and changes to her vision or speech. She is able to ambulate without difficulties; no ataxia. She is keenly alert and feels as if her mentation is at baseline; drove herself to clinic today. Patient has not had any nausea, vomiting, or diaphoresis. Patient presents HYPERtensive at 156/106 and TACHYcardic at 108 bpm. She is very anxious upon arrival; PMH (+) anxiety and panic disorder. Patient states, "I just don't feel normal". Triage staff report that patient became very dizzy when changing positions from lying to sitting following EKG; became pale.   Past Medical History:  Diagnosis Date   Anxiety disorder    Anxiety state    Cellulitis    Headache    History of 2019 novel coronavirus disease (COVID-19) 03/12/2019   Hyperlipidemia    Hypertension    Menopausal symptom    Obesity    Pain, joint, ankle    Panic disorder     Retinal vascular changes    Sprain of neck    Stricture of ureter    Upper respiratory infection    Wheezing     Past Surgical History:  Procedure Laterality Date   ABDOMINAL HYSTERECTOMY     ANKLE SURGERY     BREAST ENHANCEMENT SURGERY     CHOLECYSTECTOMY     TONSILLECTOMY      Family History  Problem Relation Age of Onset   Hypercholesterolemia Mother    Hypertension Mother    Heart disease Father    Hypertension Father    Cancer Father    Cancer Other     Social History   Tobacco Use   Smoking status: Never Smoker   Smokeless tobacco: Never Used  Substance Use Topics   Alcohol use: Not Currently   Drug use: Not Currently    Types: Marijuana    Comment: past - last use 3-4 years ago    Patient Active Problem List   Diagnosis Date Noted   Essential hypertension 02/20/2013   Chest discomfort 02/20/2013    Home Medications:    Current Meds  Medication Sig   diazepam (VALIUM) 10 MG tablet Take 0.5 tablets by mouth daily as needed.   losartan-hydrochlorothiazide (HYZAAR) 50-12.5 MG tablet Take 1 tablet by mouth daily.    Allergies:   Patient has no known allergies.  Review of Systems (ROS):  Review of systems NEGATIVE unless otherwise noted in narrative H&P section.   Vital Signs: Today's Vitals  08/10/19 1439 08/10/19 1441 08/10/19 1529  BP:  (!) 156/106   Pulse:  (!) 108   Resp:  18   Temp:  98.2 F (36.8 C)   TempSrc:  Oral   SpO2:  100%   Weight: 214 lb 15.2 oz (97.5 kg)    Height: 5\' 3"  (1.6 m)    PainSc: 0-No pain  0-No pain    Physical Exam: Physical Exam  Constitutional: She is oriented to person, place, and time. She appears distressed.  HENT:  Head: Normocephalic and atraumatic.  Eyes: Pupils are equal, round, and reactive to light.  Cardiovascular: Regular rhythm, normal heart sounds and intact distal pulses. Tachycardia present.  Pulmonary/Chest: Effort normal and breath sounds normal. Tachypnea  noted.  Musculoskeletal:     Cervical back: Normal range of motion and neck supple.  Neurological: She is alert and oriented to person, place, and time. She has normal sensation, normal strength, normal reflexes and intact cranial nerves. Gait normal. GCS score is 15.  Symptoms started 1 hour PTA. Initial NIHSS score 0.   Skin: Skin is warm and dry. No rash noted. She is not diaphoretic.  Psychiatric: Memory, affect and judgment normal. Her mood appears anxious.  Nursing note and vitals reviewed.   Urgent Care Treatments / Results:   Orders Placed This Encounter  Procedures   Basic metabolic panel   CBC with Differential   Fibrin derivatives D-Dimer ([facility] only)   EKG 12-Lead   ED EKG   Insert peripheral IV    LABS: PLEASE NOTE: all labs that were ordered this encounter are listed, however only abnormal results are displayed. Labs Reviewed  BASIC METABOLIC PANEL - Abnormal; Notable for the following components:      Result Value   Glucose, Bld 111 (*)    All other components within normal limits  CBC WITH DIFFERENTIAL/PLATELET - Abnormal; Notable for the following components:   Platelets 447 (*)    All other components within normal limits  FIBRIN DERIVATIVES D-DIMER ([facility] ONLY) - Abnormal; Notable for the following components:   Fibrin derivatives D-dimer ([facility]) 805.54 (*)    All other components within normal limits  TROPONIN I (HIGH SENSITIVITY)    URGENT CARE ECG REPORT Date: 08/10/2019 Time ECG obtained:  Rate: 108 bpm Rhythm: sinus tachycardia Axis (leads I and aVF): normal Intervals: PR 124 ms, QTc 431 ms. ST segment and T wave changes: No evidence of ST segment elevation or depression Comparison: Unable to view prior EKGs due to system downtime.    RADIOLOGY: No results found.  PROCEDURES: Procedures  MEDICATIONS RECEIVED THIS VISIT: Medications - No data to display  PERTINENT CLINICAL COURSE NOTES/UPDATES:   Initial Impression / Assessment and  Plan / Urgent Care Course:  Pertinent labs & imaging results that were available during my care of the patient were personally reviewed by me and considered in my medical decision making (see lab/imaging section of note for values and interpretations).  [name] is a 54 y.o. female who presents to [facility] today with complaints of Chest Pain and Numbness  Patient is well appearing overall in clinic today. She appears to be very anxious upon arrival; mild acute distress. Presenting symptoms (see HPI) and exam as documented above. Symptoms started approximately 1 hour PTA. Patient with numbness to the RIGHT side of her body. Exam reveals that patient is grossly; NIHSS is 0. PMH places patient at increased risk for cardiovascular disease. Given symptoms and co-morbidities (HTN, HLD, BMI is  38.08 kg/m), will pursue workup as follows:   PIV placed in anticipation of transfer from [place] to ED.    EKG shows ST at a rate of 108 bmp.    WBC 9.3. Hgb 14.1, Hct 41.5, MCV 87.4, MCH 29.7, and platelets 447,000   Na 138, K+ 3.6, glucose 111, BUN 17, and creatinine 0.92 mg/dL. Estimated Creatinine Clearance: 80.4 mL/min (by C-G formula based on SCr of 0.92 mg/dL).    hs-TnI normal at 2 ng/L   D-dimer elevated at 805.54 ng/mL (FEU)  Patient's presenting symptoms today are felt to require further evaluation and possible intervention in a setting that is capable of providing a higher level of care. Patient is going to need further labs, CT imaging, and other interventions as deemed appropriate by ED provider. I do not have CT available here today, thus I will have to send to facility that is capable of performing the anticipated advanced imaging.  I discussed with patient that her problems today are outside of the capabilities of this outpatient urgent care setting, thus my recommendations are to have her seen in the emergency department at either [facility] or [facility]. After discussion,  patient notes that she would like to be seen at [facility]. Patient condition necessitates that patient be transported to the ED via EMS transport.   Patient report called to [facility] emergency department staff; spoke with Thurmond Butts, RN (charge nurse). Nurse was advised of patient's presenting complaints, assessment in clinic, findings from work up thus far, and plans for patient to present there for ongoing evaluation and management of her symptoms. Questions fielded. Nurse advised to return a call to [facility] with any questions or concerns pertaining to the care that Daphene Jaeger received here today. Hospital staff aware that patient will be presenting to their facility via EMS today; PIV has been established.   Final Clinical Impressions / Urgent Care Diagnoses:   Final diagnoses:  Paresthesias  Tachycardia  Anxiety state  Elevated d-dimer    New Prescriptions:  [place] Controlled Substance Registry consulted? Not Applicable  No orders of the defined types were placed in this encounter.   Recommended Follow up Care:   Follow-up Information    Go to  [facility].   Specialty: Emergency Medicine Why: EMS will take you to [facility] ED for further evaluation and treatment. Contact information: [place] [id number] [address] [place] [phone]        NOTE: This note was prepared using Dragon dictation software along with smaller phrase technology. Despite my best ability to proofread, there is the potential that transcriptional errors may still occur from this process, and are completely unintentional.     Karen Kitchens, NP 08/10/19 [phone]

## 2019-08-10 NOTE — Discharge Instructions (Addendum)
As we discussed, I suspect your symptoms are due to either anxiety or potentially a heart rhythm change.  Your CT imaging and lab work was very reassuring.  I would recommend taking your value more often, and calling your doctor to set up possible outpatient Holter monitoring.  Avoid caffeine.  Drink plenty of fluids.

## 2019-08-10 NOTE — ED Triage Notes (Signed)
Patient states that around 1 hour ago she started having chest "fluttering" that goes from right sided of chest up through her head. States that she feels numb on the right side of her head and neck and just doesn't feel normal.

## 2020-10-22 IMAGING — CR DG CHEST 2V
2 series · 2 of 2 positions shown · non-contrast
Comparison: 01/24/2018

CLINICAL DATA: Right-sided chest pain for 1 hour. History of COVID
19.

EXAM:
CHEST - 2 VIEW

[chest pa]
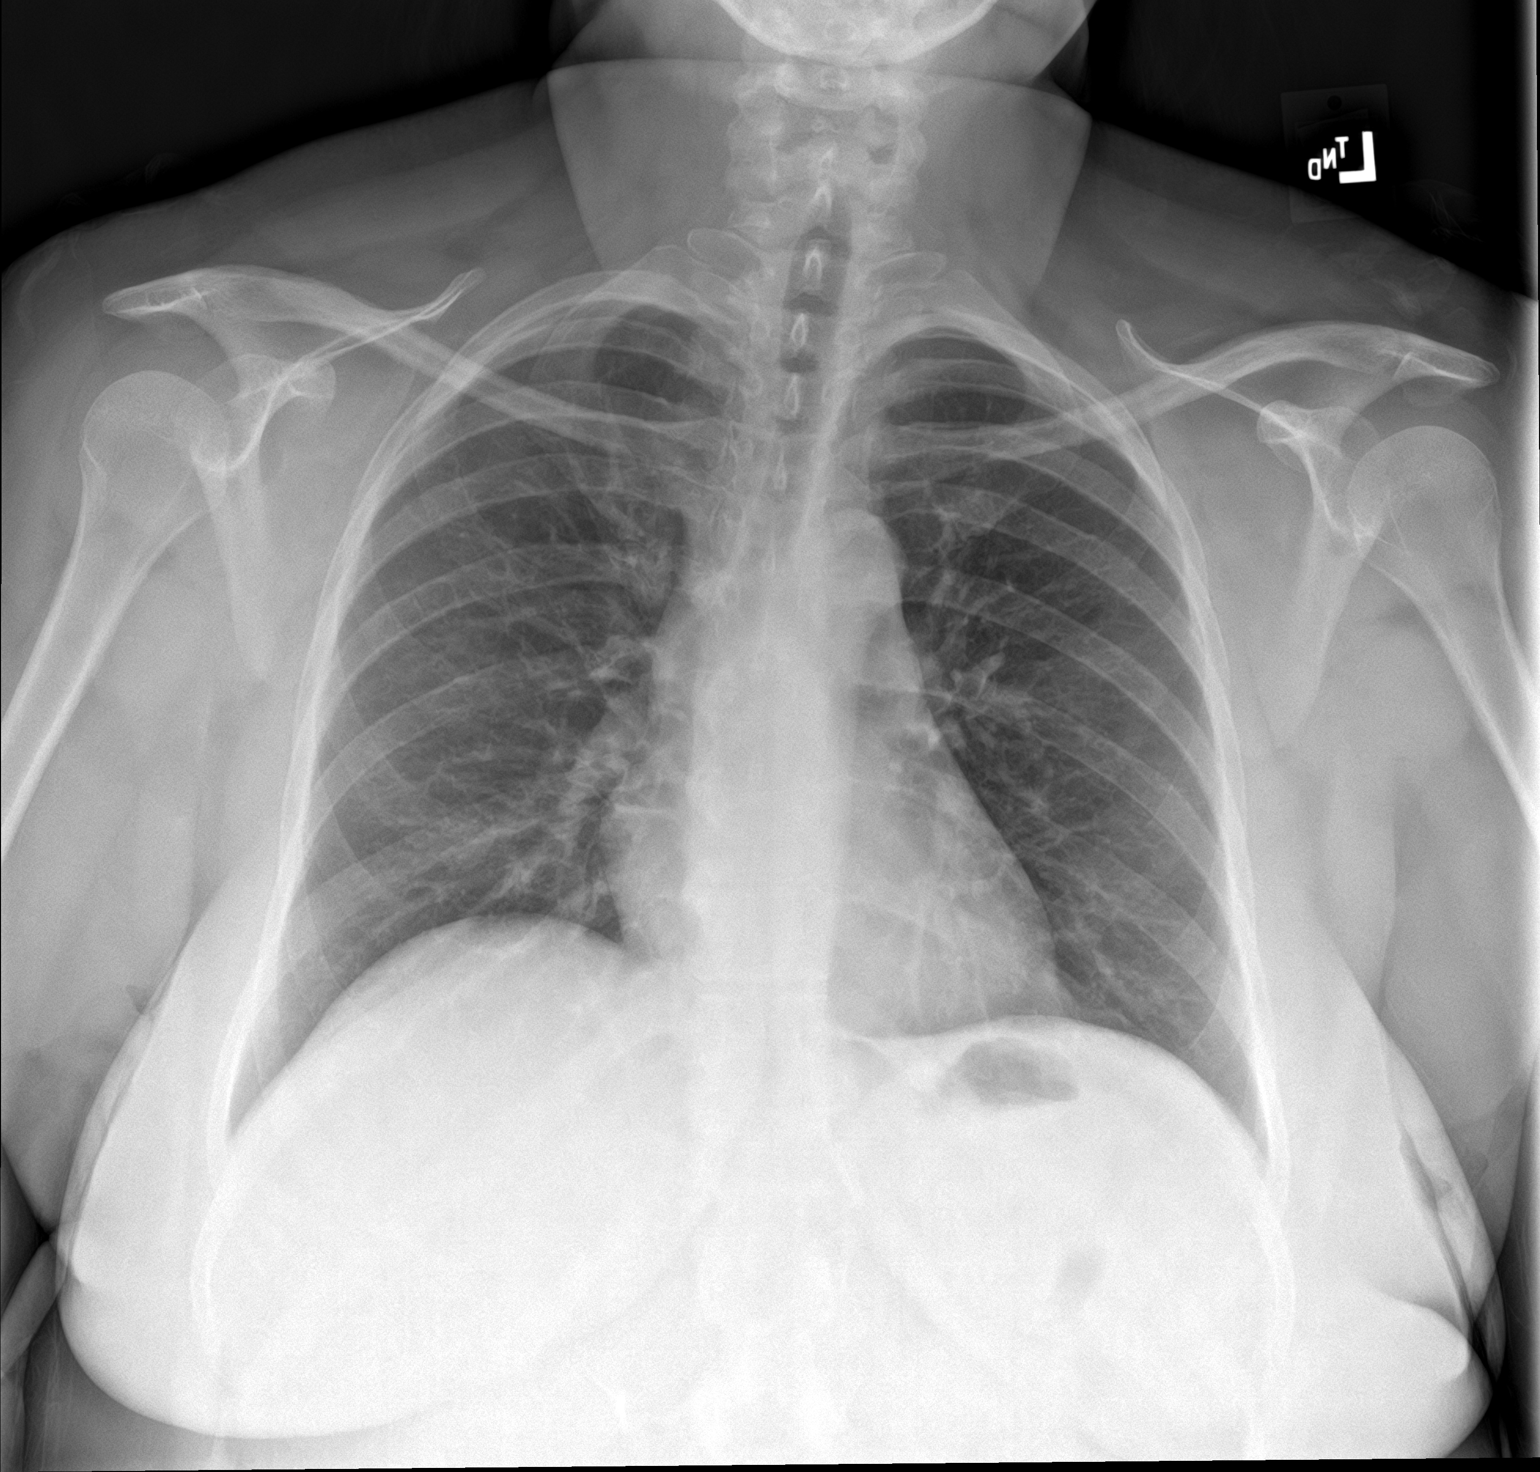

[chest lat]
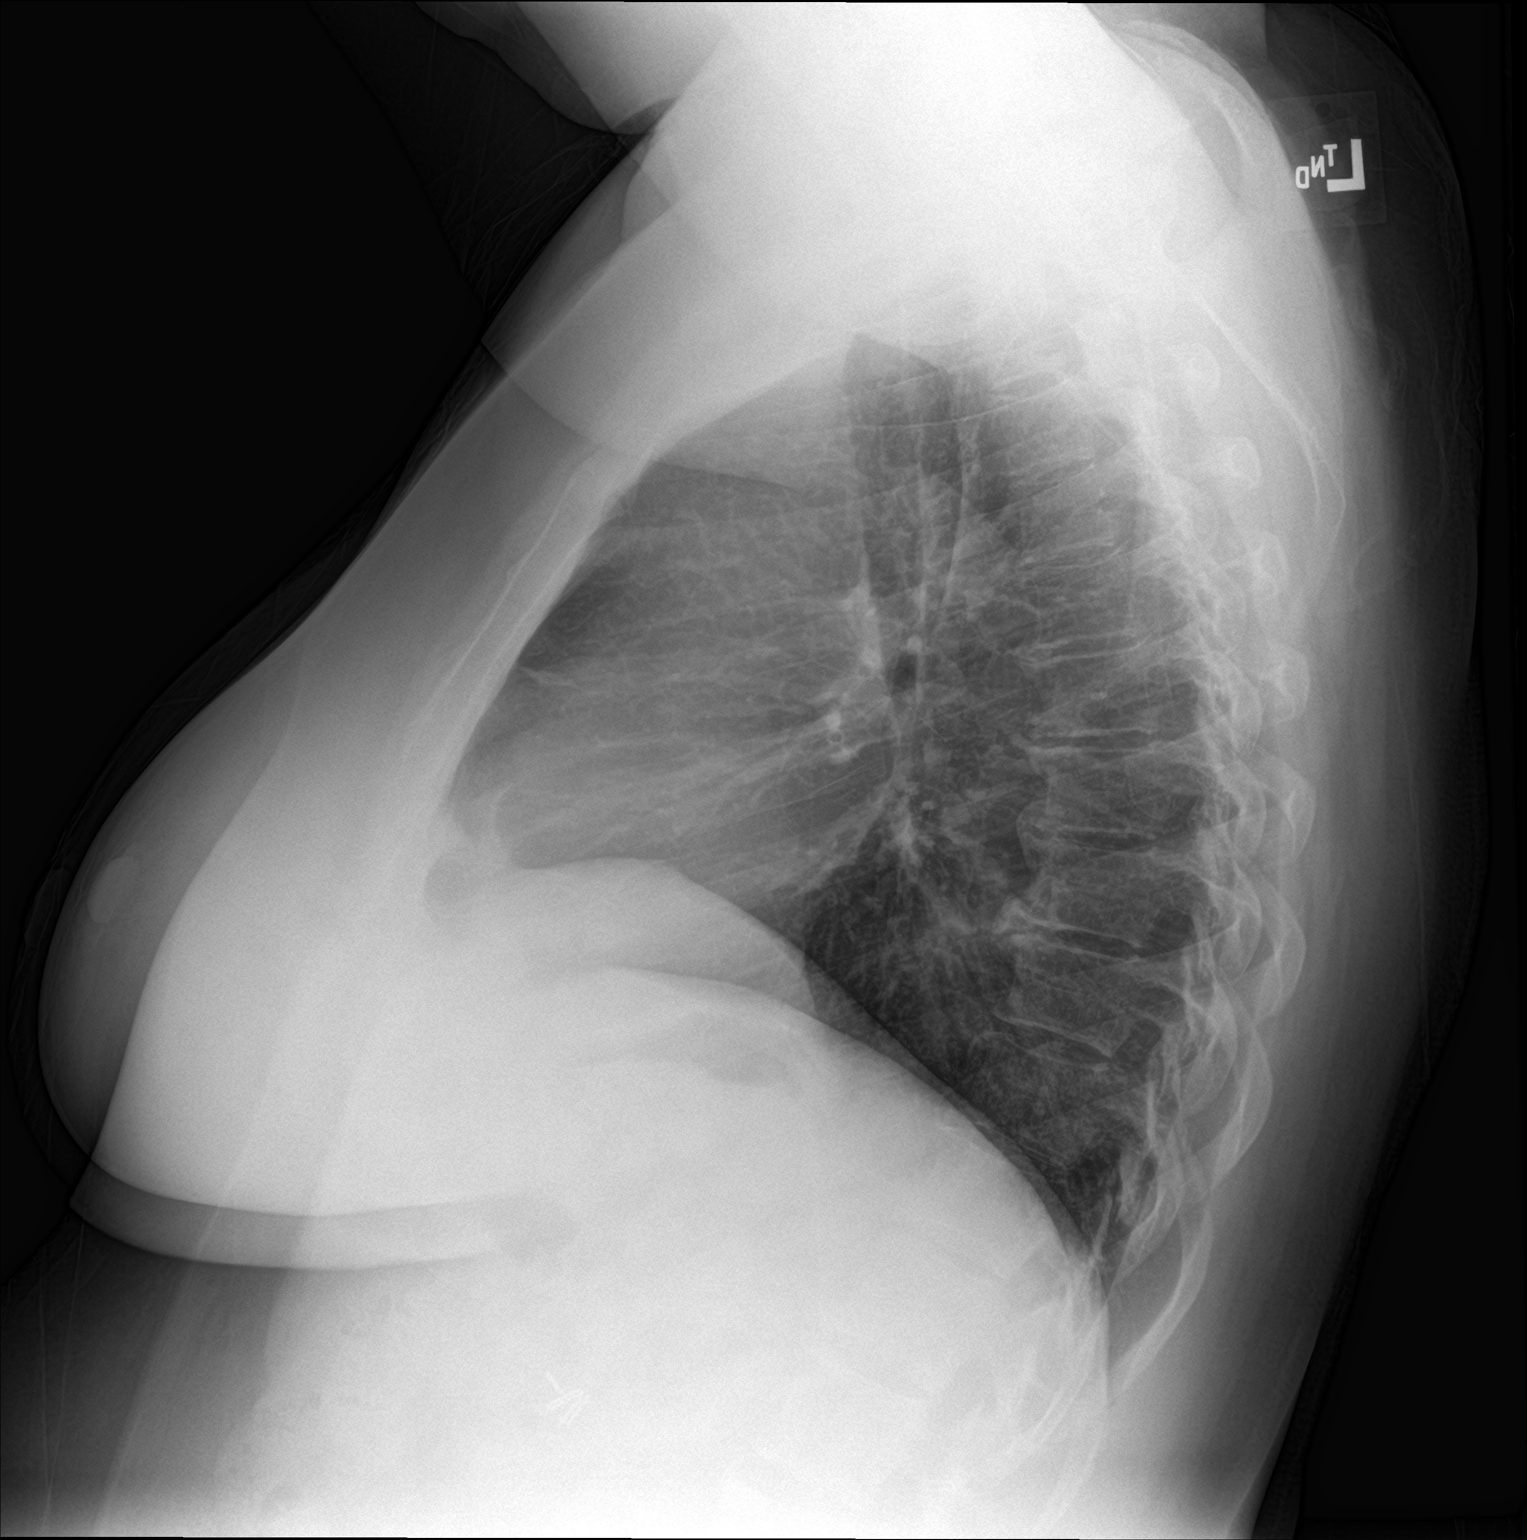

[2 of 2 positions shown; findings below may reference images not displayed]

FINDINGS: Midline trachea. Normal heart size and mediastinal contours. No
pleural effusion or pneumothorax. Clear lungs. Cholecystectomy
clips.
IMPRESSION: No acute cardiopulmonary disease.

## 2020-10-22 IMAGING — CT CT ANGIO CHEST
3 of 7 series · 19 of 36 positions shown · IV contrast (APPLIED)
Comparison: 08/10/2019

CLINICAL DATA: Short of breath, anxiety, arrhythmia

EXAM:
CT ANGIOGRAPHY CHEST WITH CONTRAST
TECHNIQUE: Multidetector CT imaging of the chest was performed using the
standard protocol during bolus administration of intravenous
contrast. Multiplanar CT image reconstructions and MIPs were
obtained to evaluate the vascular anatomy.
CONTRAST:  75mL OMNIPAQUE IOHEXOL 350 MG/ML SOLN

[Series 5: lung · axial · 0.57mm/px · z∈[-513,-387]mm · 3 of 84 slices shown]
[im 21/84  mediastinal]
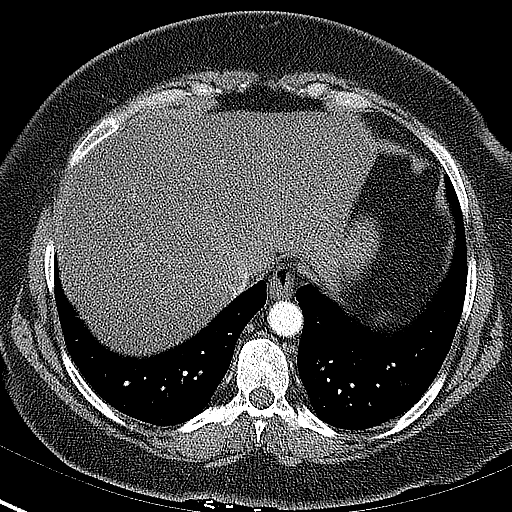
[im 42/84  mediastinal]
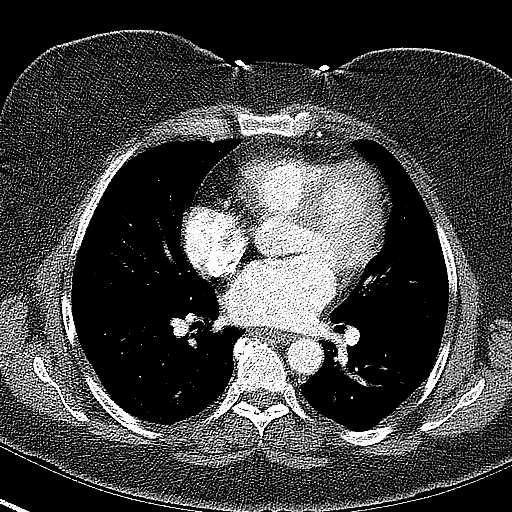
[im 63/84  mediastinal]
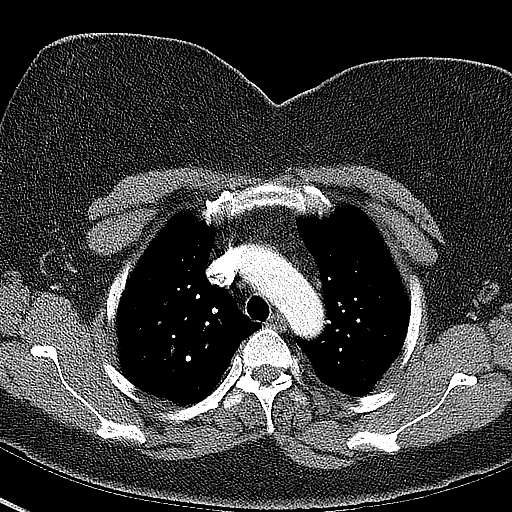

[Series 6: thins · axial · 0.61mm/px · z∈[-564,-340]mm · 15 of 257 slices shown]
[im 17/257  lung]
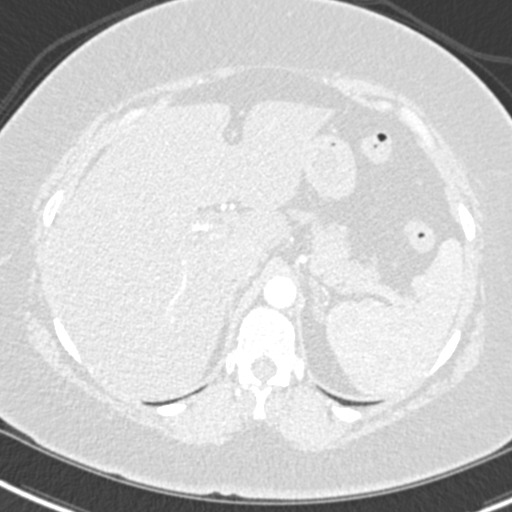
[im 33/257  mediastinal]
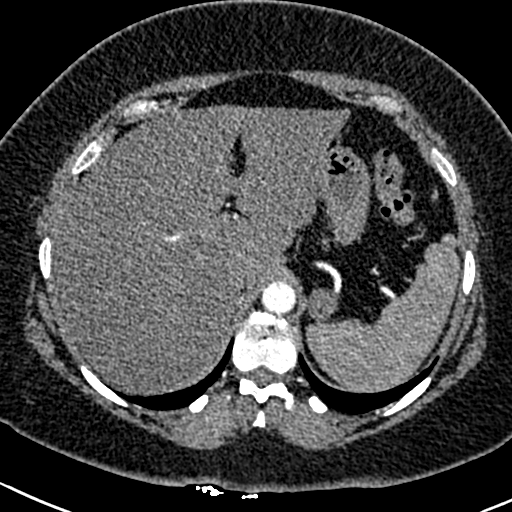
[im 49/257  lung]
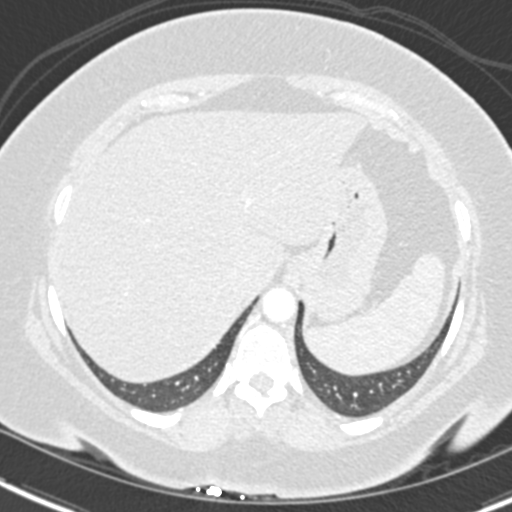
[im 65/257  mediastinal]
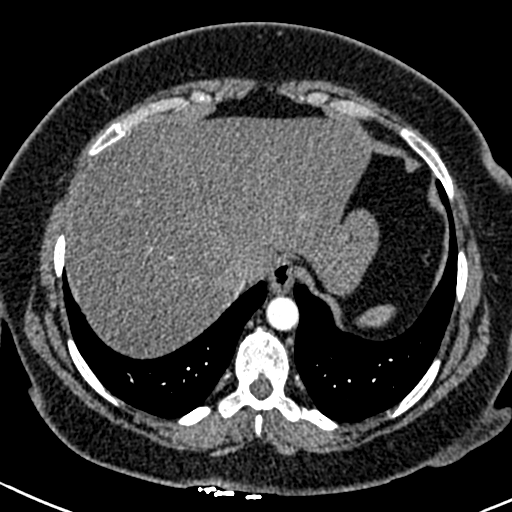
[im 81/257  lung]
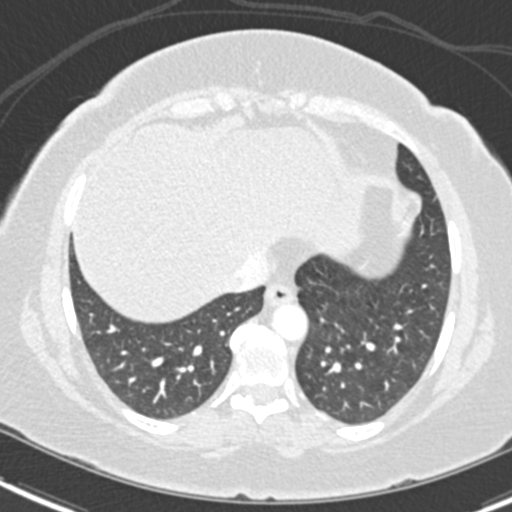
[im 97/257  mediastinal]
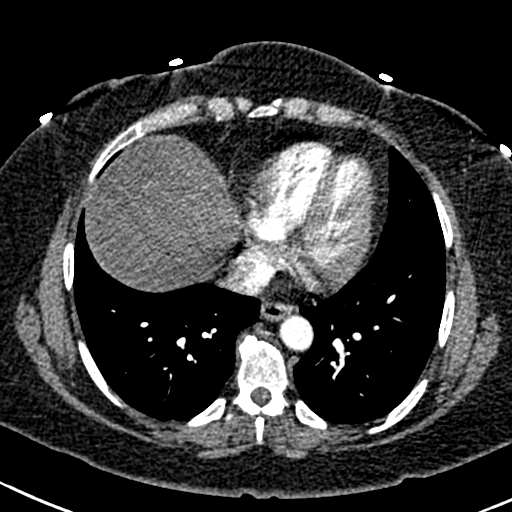
[im 113/257  lung]
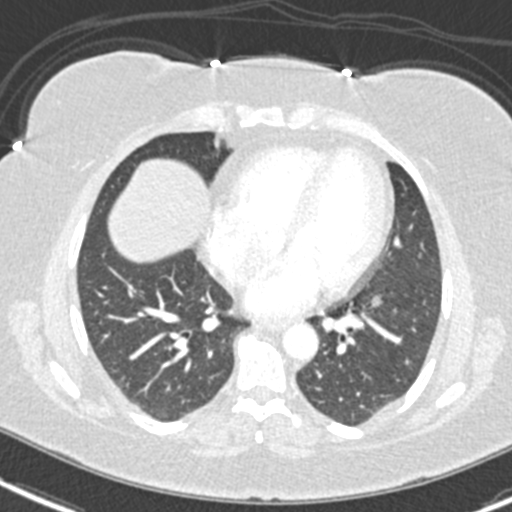
[im 129/257  mediastinal]
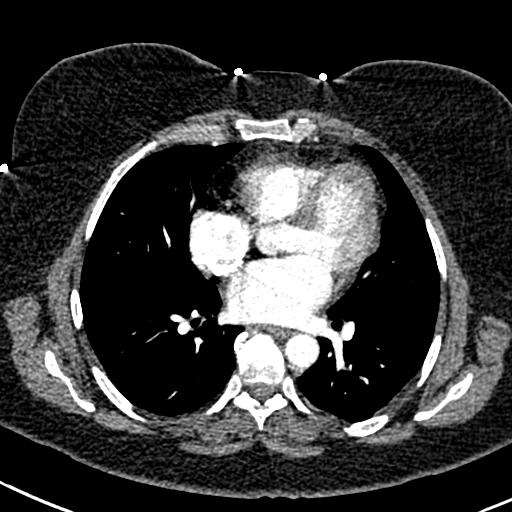
[im 145/257  lung]
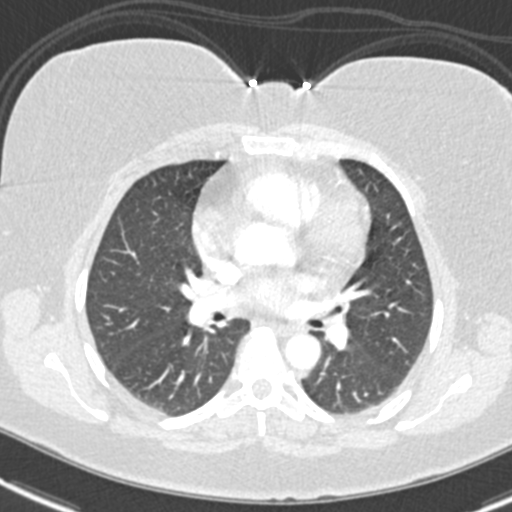
[im 161/257  mediastinal]
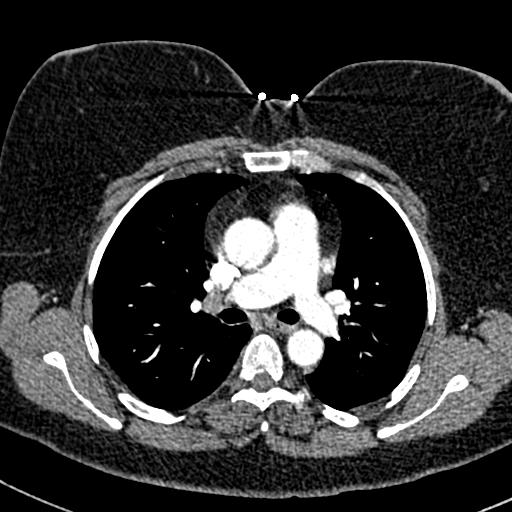
[im 177/257  lung]
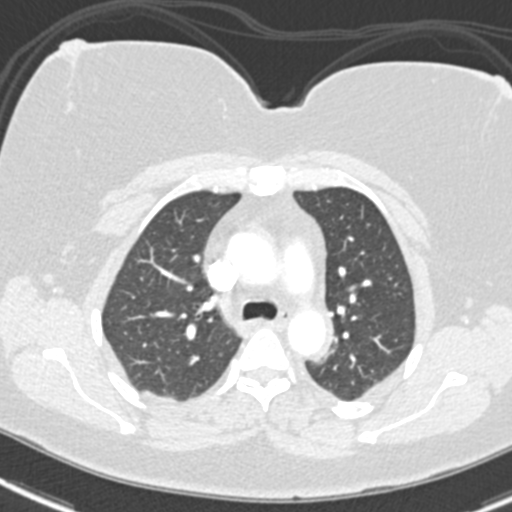
[im 193/257  mediastinal]
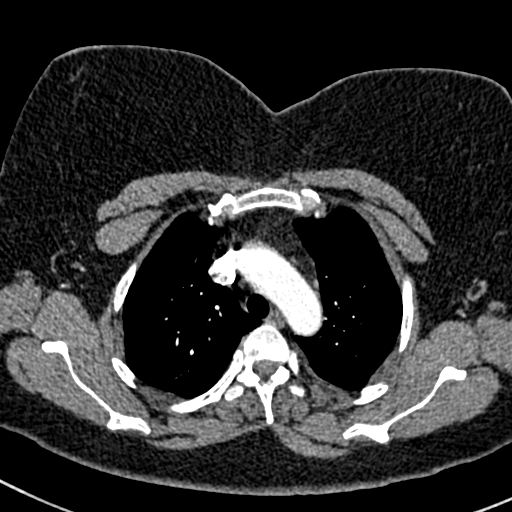
[im 209/257  lung]
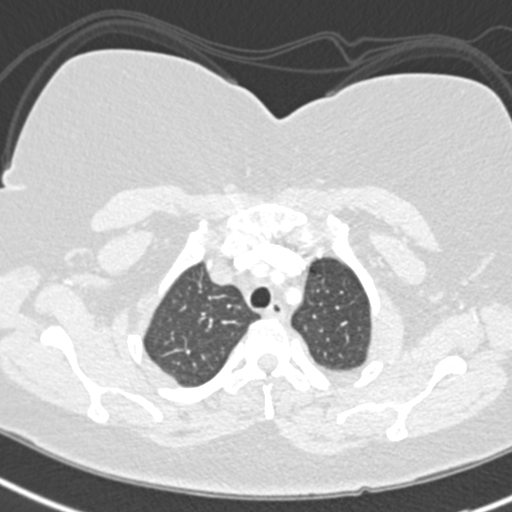
[im 225/257  mediastinal]
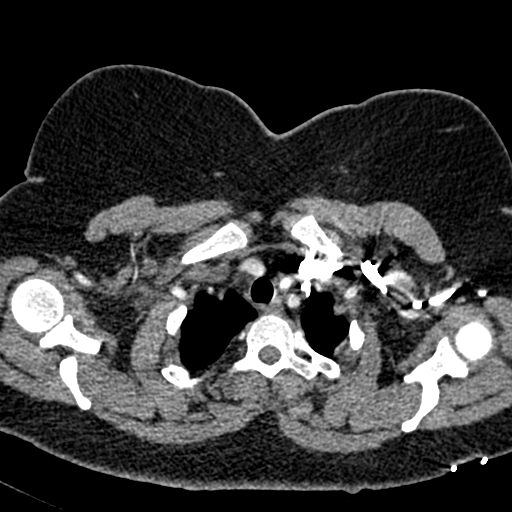
[im 241/257  lung]
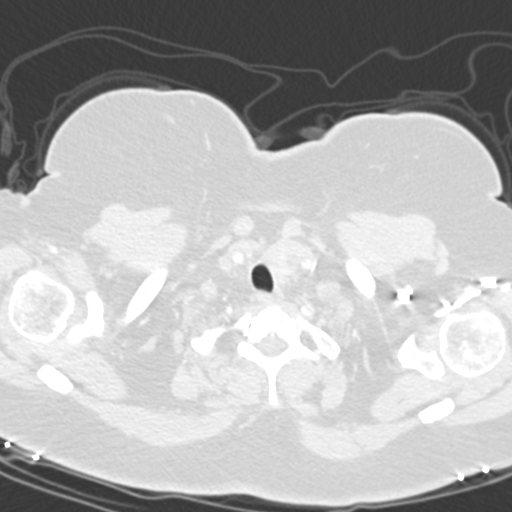

[Series 8: coronal mpr · coronal · 0.52mm/px · 1 of 97 slices shown]
[im 49/97  mediastinal]
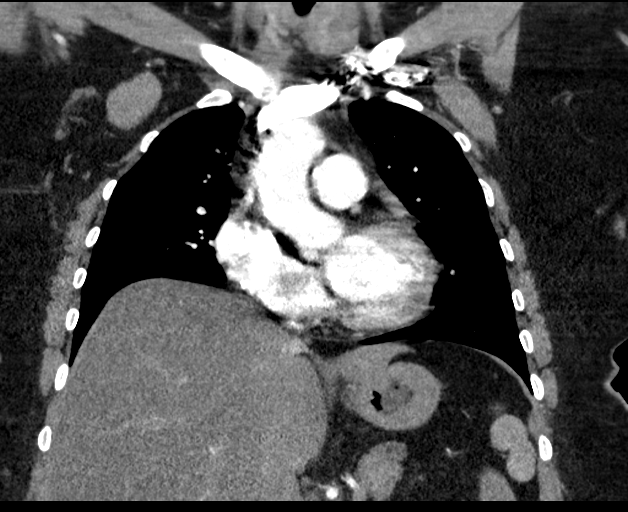

[19 of 36 positions shown; findings below may reference images not displayed]

FINDINGS: Cardiovascular: This is a technically adequate evaluation of the
pulmonary vasculature. No filling defects or pulmonary emboli.

The heart is unremarkable without pericardial effusion. Thoracic
aorta is normal without aneurysm or dissection.

Mediastinum/Nodes: No enlarged mediastinal, hilar, or axillary lymph
nodes. Thyroid gland, trachea, and esophagus demonstrate no
significant findings.

Lungs/Pleura: No airspace disease, effusion, or pneumothorax.
Central airways are patent.

Upper Abdomen: Diffuse hepatic steatosis.

Musculoskeletal: No acute or destructive bony lesions. Reconstructed
images demonstrate no additional findings.

Review of the MIP images confirms the above findings.
IMPRESSION: 1. No CT evidence of pulmonary embolism.
2. Diffuse hepatic steatosis.

## 2020-12-30 ENCOUNTER — Encounter: Payer: Self-pay | Admitting: Radiology

## 2020-12-30 ENCOUNTER — Emergency Department: Payer: BC Managed Care – PPO

## 2020-12-30 ENCOUNTER — Ambulatory Visit (INDEPENDENT_AMBULATORY_CARE_PROVIDER_SITE_OTHER)
Admission: EM | Admit: 2020-12-30 | Discharge: 2020-12-30 | Disposition: A | Discharge status: Other Acute Inpatient Hospital | Payer: BC Managed Care – PPO | Source: Home / Self Care

## 2020-12-30 DIAGNOSIS — R079 Chest pain, unspecified: Secondary | ICD-10-CM | POA: Insufficient documentation

## 2020-12-30 DIAGNOSIS — Z8616 Personal history of COVID-19: Secondary | ICD-10-CM | POA: Insufficient documentation

## 2020-12-30 DIAGNOSIS — I10 Essential (primary) hypertension: Secondary | ICD-10-CM | POA: Insufficient documentation

## 2020-12-30 DIAGNOSIS — Z79899 Other long term (current) drug therapy: Secondary | ICD-10-CM | POA: Diagnosis not present

## 2020-12-30 DIAGNOSIS — I160 Hypertensive urgency: Secondary | ICD-10-CM | POA: Insufficient documentation

## 2020-12-30 DIAGNOSIS — R6884 Jaw pain: Secondary | ICD-10-CM | POA: Diagnosis not present

## 2020-12-30 DIAGNOSIS — R202 Paresthesia of skin: Secondary | ICD-10-CM | POA: Insufficient documentation

## 2020-12-30 DIAGNOSIS — R002 Palpitations: Secondary | ICD-10-CM | POA: Insufficient documentation

## 2020-12-30 DIAGNOSIS — R200 Anesthesia of skin: Secondary | ICD-10-CM

## 2020-12-30 DIAGNOSIS — R197 Diarrhea, unspecified: Secondary | ICD-10-CM | POA: Insufficient documentation

## 2020-12-30 LAB — COMPREHENSIVE METABOLIC PANEL
ALT: 20 U/L (ref 0–44)
AST: 25 U/L (ref 15–41)
Albumin: 4.2 g/dL (ref 3.5–5.0)
Alkaline Phosphatase: 49 U/L (ref 38–126)
Anion gap: 8 (ref 5–15)
BUN: 16 mg/dL (ref 6–20)
CO2: 29 mmol/L (ref 22–32)
Calcium: 9.3 mg/dL (ref 8.9–10.3)
Chloride: 100 mmol/L (ref 98–111)
Creatinine, Ser: 0.89 mg/dL (ref 0.44–1.00)
GFR, Estimated: >60 mL/min (ref >60)
Glucose, Bld: 160 mg/dL — ABNORMAL HIGH (ref 70–99)
Potassium: 3.4 mmol/L — ABNORMAL LOW (ref 3.5–5.1)
Sodium: 137 mmol/L (ref 135–145)
Total Bilirubin: 0.9 mg/dL (ref 0.3–1.2)
Total Protein: 7.9 g/dL (ref 6.5–8.1)

## 2020-12-30 LAB — TROPONIN I (HIGH SENSITIVITY)
Troponin I (High Sensitivity): 3 ng/L (ref <18)
Troponin I (High Sensitivity): 5 ng/L (ref <18)

## 2020-12-30 LAB — CBC
HCT: 35.8 % — ABNORMAL LOW (ref 36.0–46.0)
Hemoglobin: 12.8 g/dL (ref 12.0–15.0)
MCH: 31.3 pg (ref 26.0–34.0)
MCHC: 35.8 g/dL (ref 30.0–36.0)
MCV: 87.5 fL (ref 80.0–100.0)
Platelets: 367 10*3/uL (ref 150–400)
RBC: 4.09 MIL/uL (ref 3.87–5.11)
RDW: 12.2 % (ref 11.5–15.5)
WBC: 9.2 10*3/uL (ref 4.0–10.5)
nRBC: 0 % (ref 0.0–0.2)

## 2020-12-30 LAB — GLUCOSE, CAPILLARY: Glucose-Capillary: 119 mg/dL — ABNORMAL HIGH (ref 70–99)

## 2020-12-30 NOTE — ED Triage Notes (Signed)
EMS brings pt in from [facility] for c/o rt sided CP radiating into jaw; 0/10 pain at present

## 2020-12-30 NOTE — ED Triage Notes (Addendum)
Pt in with co right sided chest pain states started around 1600 today. States also had some numbness to face and lips with worsening numbness to right face. Pt denies any weakness or numbness to extremities. PT has had hx of the same and saw Nehemiah Massed and was put on metoprolol. States numbness to face has improved but still present. Pain states chest pain was 8/10 and down to 2/10 after 1 nitro spray.

## 2020-12-30 NOTE — ED Provider Notes (Signed)
Emergency Medicine Provider Triage Evaluation Note  SHENEA GIACOBBE , a 55 y.o. female  was evaluated in triage.  Pt complains of chest pain pain around the right shoulder and paresthesias.  Review of Systems  Positive: Chest pain that has now improved and was experiencing paresthesias around the right face right shoulder. Negative: Headache  Physical Exam  BP (!) 153/81 (BP Location: Left Arm)   Pulse (!) 103   Temp 99.1 F (37.3 C) (Oral)   Resp 20   Ht 5\' 3"  (1.6 m)   Wt 98.9 kg   SpO2 99%   BMI 38.62 kg/m  Gen:   Awake, no distress   Resp:  Normal effort  MSK:   Moves extremities without difficulty  Other:  Speaks with clear voice.  No obvious cranial nerve deficits  Medical Decision Making  Medically screening exam initiated at 7:55 PM.  Appropriate orders placed.  DAMONIE FURNEY was informed that the remainder of the evaluation will be completed by another provider, this initial triage assessment does not replace that evaluation, and the importance of remaining in the ED until their evaluation is complete.  Patient's triage EKG is also reviewed, in brief I do not see evidence of acute cardiac ischemia. CT head ordered upon review of urgent care notes   Delman Kitten, MD 12/30/20 1956

## 2020-12-30 NOTE — ED Notes (Signed)
EMS administer Nitro spray  Right sided CP 7/10 No change in pain at this time after Nitro given  HR 103, ST on EMS monitor  BP 174/95

## 2020-12-30 NOTE — ED Triage Notes (Signed)
Pt here with C/O Facial numbness, whole face, chest pain. Provider in to see pt.

## 2020-12-30 NOTE — Discharge Instructions (Addendum)
As we discussed, your neurological symptoms are concerning especially in the given setting of your elevated blood pressure and I feel you should be evaluated at a higher level of care and imaging of your brain performed to rule out any acute intracranial process.

## 2020-12-30 NOTE — ED Notes (Signed)
[facility] ACLS truck arrived, from basic truck Pt moved from ED bed to EMS stretcher, w/o difficulty.  Pt remains A&O x4, able to follow commands.

## 2020-12-30 NOTE — ED Notes (Addendum)
[facility] arrived, report given, NAD noted,  Pt to be transported to [facility] A&O x4.  HR 102, preparing for transport. ST on EMS monitor

## 2020-12-30 NOTE — ED Provider Notes (Addendum)
[facility]    CSN: [id number] Arrival date & time: 12/30/20  1825      History   Chief Complaint No chief complaint on file.   HPI Meagan Becker is a 55 y.o. female.   HPI  55 year old female here for evaluation of facial numbness and chest pain.  Patient reports that she has been experiencing some weird sensations to her facial region over the past couple days but approximately an hour before her arrival she developed numbness to her entire face, chest pain on the right side of her chest that radiates up her right neck, headache, nausea, shortness of breath.  She denies any changes in vision.  She reports that she checked her blood pressure at home and it was 202/178.  Past Medical History:  Diagnosis Date   Anxiety disorder    Anxiety state    Cellulitis    Headache    History of 2019 novel coronavirus disease (COVID-19) 03/12/2019   Hyperlipidemia    Hypertension    Menopausal symptom    Obesity    Pain, joint, ankle    Panic disorder    Retinal vascular changes    Sprain of neck    Stricture of ureter    Upper respiratory infection    Wheezing     Patient Active Problem List   Diagnosis Date Noted   Essential hypertension 02/20/2013   Chest discomfort 02/20/2013    Past Surgical History:  Procedure Laterality Date   ABDOMINAL HYSTERECTOMY     ANKLE SURGERY     BREAST ENHANCEMENT SURGERY     CHOLECYSTECTOMY     TONSILLECTOMY      OB History   No obstetric history on file.      Home Medications    Prior to Admission medications   Medication Sig Start Date End Date Taking? Authorizing Provider  diazepam (VALIUM) 10 MG tablet Take 0.5 tablets by mouth daily as needed. 03/06/16   [provider]  losartan-hydrochlorothiazide (HYZAAR) 50-12.5 MG tablet Take 1 tablet by mouth daily.    [provider]    Family History Family History  Problem Relation Age of Onset   Hypercholesterolemia Mother    Hypertension  Mother    Heart disease Father    Hypertension Father    Cancer Father    Cancer Other     Social History Social History   Tobacco Use   Smoking status: Never   Smokeless tobacco: Never  Vaping Use   Vaping Use: Never used  Substance Use Topics   Alcohol use: Not Currently   Drug use: Not Currently    Types: Marijuana    Comment: past - last use 3-4 years ago     Allergies   Patient has no known allergies.   Review of Systems Review of Systems  Constitutional:  Negative for activity change, appetite change and fever.  Eyes:  Negative for visual disturbance.  Respiratory:  Positive for shortness of breath.   Cardiovascular:  Positive for chest pain.  Gastrointestinal:  Positive for nausea.  Neurological:  Positive for numbness and headaches. Negative for weakness.  Hematological: Negative.   Psychiatric/Behavioral: Negative.      Physical Exam Triage Vital Signs ED Triage Vitals [12/30/20 1838]  Enc Vitals Group     BP (!) 160/101     Pulse Rate (!) 115     Resp (!) 24     Temp      Temp src  SpO2      Weight      Height      Head Circumference      Peak Flow      Pain Score      Pain Loc      Pain Edu?      Excl. in [place]?    No data found.  Updated Vital Signs BP (!) 160/101   Pulse (!) 115   Resp (!) 24   Visual Acuity Right Eye Distance:   Left Eye Distance:   Bilateral Distance:    Right Eye Near:   Left Eye Near:    Bilateral Near:     Physical Exam Vitals and nursing note reviewed.  Constitutional:      General: She is in acute distress.     Appearance: Normal appearance. She is obese. She is not ill-appearing.  HENT:     Head: Normocephalic and atraumatic.     Mouth/Throat:     Mouth: Mucous membranes are moist.     Pharynx: Oropharynx is clear. No posterior oropharyngeal erythema.  Eyes:     General: No scleral icterus.    Extraocular Movements: Extraocular movements intact.     Pupils: Pupils are equal, round, and  reactive to light.  Cardiovascular:     Rate and Rhythm: Normal rate and regular rhythm.     Pulses: Normal pulses.     Heart sounds: Normal heart sounds. No murmur heard.   No gallop.  Pulmonary:     Effort: Pulmonary effort is normal.     Breath sounds: Normal breath sounds. No wheezing, rhonchi or rales.  Skin:    General: Skin is warm and dry.     Capillary Refill: Capillary refill takes less than 2 seconds.     Findings: No erythema or rash.  Neurological:     General: No focal deficit present.     Mental Status: She is alert and oriented to person, place, and time.  Psychiatric:        Mood and Affect: Mood normal.        Behavior: Behavior normal.        Thought Content: Thought content normal.        Judgment: Judgment normal.     UC Treatments / Results  Labs (all labs ordered are listed, but only abnormal results are displayed) Labs Reviewed  GLUCOSE, CAPILLARY - Abnormal; Notable for the following components:      Result Value   Glucose-Capillary 119 (*)    All other components within normal limits  CBG MONITORING, ED    EKG EKG sinus tachycardia with a ventricular rate of 113 bpm PR interval 104 ms QRS duration 80 ms QT/QTc 338/463 ms Sinus tachycardia no ST or T wave abnormalities noted.   Radiology No results found.  Procedures Procedures (including critical care time)  Medications Ordered in UC Medications - No data to display  Initial Impression / Assessment and Plan / UC Course  I have reviewed the triage vital signs and the nursing notes.  Pertinent labs & imaging results that were available during my care of the patient were reviewed by me and considered in my medical decision making (see chart for details).  Patient is a very anxious 55 year old female here for evaluation of facial numbness with an intensification of the numbness around her mouth that started approximate hour prior to arrival.  This was in conjunction with right-sided chest  pain that radiates of the right side of  her neck.  She reports that she has had some ongoing intermittent numbness to her face and upper chest secondary to the believe a pinched nerve in her shoulder but this is different.  This is also associated with nausea shortness of breath, and a headache.  She denies any changes in vision.  Patient was hypertensive at home with a blood pressure of 202/178 and she is 160/101 here in clinic.  Patient's cranial nerves are largely intact other than she has in intensified left-sided facial numbness.  Her face is symmetrical, EOM's intact, pupils reground reactive.  Bilateral grips and upper extremity strength are 5/5 and lower extremity strength is 5/5.  Cardiopulmonary exam reveals tachycardic heart rhythm with S1-S2 heart sounds.  Lung sounds are clear to station all fields.  EKG was collected at triage which shows sinus tach without any specific ST changes.  No significant change when compared to 08/10/2019.  Due to patient's neurologic symptoms, and elevated blood pressure, I feel is best that this patient be evaluated in the emergency department as we do not have the ability to perform a CT scan in the urgent care.  I advised the patient of my thought process and she is in agreement.  EMS activated.  Report given to [facility] EMS paramedic.  Care transferred.   Final Clinical Impressions(s) / UC Diagnoses   Final diagnoses:  Facial numbness  Hypertensive urgency  Chest pain, unspecified type     Discharge Instructions      As we discussed, your neurological symptoms are concerning especially in the given setting of your elevated blood pressure and I feel you should be evaluated at a higher level of care and imaging of your brain performed to rule out any acute intracranial process.     ED Prescriptions   None    PDMP not reviewed this encounter.   Margarette Canada, NP 12/30/20 Yevette Edwards    Margarette Canada, NP 12/30/20 [phone]

## 2020-12-31 ENCOUNTER — Emergency Department
Admission: EM | Admit: 2020-12-31 | Discharge: 2020-12-31 | Disposition: A | Discharge status: Home / Self Care | Payer: BC Managed Care – PPO | Attending: Emergency Medicine | Admitting: Emergency Medicine

## 2020-12-31 ENCOUNTER — Other Ambulatory Visit: Payer: Self-pay

## 2020-12-31 DIAGNOSIS — R202 Paresthesia of skin: Secondary | ICD-10-CM

## 2020-12-31 DIAGNOSIS — R002 Palpitations: Secondary | ICD-10-CM

## 2020-12-31 DIAGNOSIS — R079 Chest pain, unspecified: Secondary | ICD-10-CM

## 2020-12-31 MED ORDER — ASPIRIN 81 MG PO CHEW
324.0000 mg | CHEWABLE_TABLET | Freq: Once | ORAL | Status: DC
  Filled 2020-12-31: qty 4

## 2020-12-31 NOTE — ED Provider Notes (Signed)
[facility] Emergency Department Provider Note  ____________________________________________  Time seen: Approximately 5:15 AM  I have reviewed the triage vital signs and the nursing notes.   HISTORY  Chief Complaint Chest Pain and Diarrhea   HPI Meagan Becker is a 55 y.o. female with a history of anxiety, hypertension, hyperlipidemia, obesity, panic disorder who presents for evaluation of chest pain and numbness.  Patient reports that she was at home earlier in the evening cooking dinner when she started feeling pins-and-needles throughout her entire face.  She reports that this is not the first time she has experienced the symptoms.  She usually gets pretty anxious when they develop.  After she started feeling anxious she started having some sharp pain in her chest going up to her jaw.  She decided to drive herself to the emergency room but became concerned during the drive and pulled into an urgent care where EMS was called.  She was given a full aspirin and a nitro spray at urgent care.  She reports that the chest pain fully resolved after that.  She reports that her numbness and tingling sensation resolved 2 hours after the started.  Patient reports that since being in the waiting room she has had some fluttering sensations in her heart but no chest pain, no headache, no slurred speech, no facial droop, no unilateral weakness or numbness.  Patient has been seen once for similar symptoms back in May.  Since then she was placed on metoprolol for possible dysrhythmias.  She has been seen by her cardiologist for the symptoms as well.   Past Medical History:  Diagnosis Date   Anxiety disorder    Anxiety state    Cellulitis    Headache    History of 2019 novel coronavirus disease (COVID-19) 03/12/2019   Hyperlipidemia    Hypertension    Menopausal symptom    Obesity    Pain, joint, ankle    Panic disorder    Retinal vascular changes    Sprain of neck     Stricture of ureter    Upper respiratory infection    Wheezing     Patient Active Problem List   Diagnosis Date Noted   Essential hypertension 02/20/2013   Chest discomfort 02/20/2013    Past Surgical History:  Procedure Laterality Date   ABDOMINAL HYSTERECTOMY     ANKLE SURGERY     BREAST ENHANCEMENT SURGERY     CHOLECYSTECTOMY     TONSILLECTOMY      Prior to Admission medications   Medication Sig Start Date End Date Taking? Authorizing Provider  diazepam (VALIUM) 10 MG tablet Take 0.5 tablets by mouth daily as needed. 03/06/16   [provider]  losartan-hydrochlorothiazide (HYZAAR) 50-12.5 MG tablet Take 1 tablet by mouth daily.    [provider]    Allergies Patient has no known allergies.  Family History  Problem Relation Age of Onset   Hypercholesterolemia Mother    Hypertension Mother    Heart disease Father    Hypertension Father    Cancer Father    Cancer Other     Social History Social History   Tobacco Use   Smoking status: Never   Smokeless tobacco: Never  Vaping Use   Vaping Use: Never used  Substance Use Topics   Alcohol use: Not Currently   Drug use: Not Currently    Types: Marijuana    Comment: past - last use 3-4 years ago    Review of  Systems  Constitutional: Negative for fever. Eyes: Negative for visual changes. ENT: Negative for sore throat. Neck: No neck pain  Cardiovascular: + chest pain and fluttering Respiratory: Negative for shortness of breath. Gastrointestinal: Negative for abdominal pain, vomiting or diarrhea. Genitourinary: Negative for dysuria. Musculoskeletal: Negative for back pain. Skin: Negative for rash. Neurological: Negative for headaches, weakness or numbness. + whole face pins and needles Psych: No SI or HI  ____________________________________________   PHYSICAL EXAM:  VITAL SIGNS: Vitals:   12/31/20 0031 12/31/20 0514  BP: (!) 150/73 (!) 146/81  Pulse: 81 84  Resp: 18 18  Temp:     SpO2: 100% 100%     Constitutional: Alert and oriented. Well appearing and in no apparent distress. Very anxious appearing HEENT:      Head: Normocephalic and atraumatic.         Eyes: Conjunctivae are normal. Sclera is non-icteric.       Mouth/Throat: Mucous membranes are moist.       Neck: Supple with no signs of meningismus. Cardiovascular: Regular rate and rhythm. No murmurs, gallops, or rubs. 2+ symmetrical distal pulses are present in all extremities. No JVD. Respiratory: Normal respiratory effort. Lungs are clear to auscultation bilaterally.  Gastrointestinal: Soft, non tender, and non distended with positive bowel sounds. No rebound or guarding. Genitourinary: No CVA tenderness. Musculoskeletal:  No edema, cyanosis, or erythema of extremities. Neurologic: Normal speech and language. Face is symmetric. EOMI, PERRL, Moving all extremities. No gross focal neurologic deficits are appreciated. Normal gait. Skin: Skin is warm, dry and intact. No rash noted. Psychiatric: Mood and affect are normal. Speech and behavior are normal.  ____________________________________________   LABS (all labs ordered are listed, but only abnormal results are displayed)  Labs Reviewed  CBC - Abnormal; Notable for the following components:      Result Value   HCT 35.8 (*)    All other components within normal limits  COMPREHENSIVE METABOLIC PANEL - Abnormal; Notable for the following components:   Potassium 3.4 (*)    Glucose, Bld 160 (*)    All other components within normal limits  TROPONIN I (HIGH SENSITIVITY)  TROPONIN I (HIGH SENSITIVITY)   ____________________________________________  EKG  ED ECG REPORT I, Rudene Re, the attending physician, personally viewed and interpreted this ECG.   19:49 -sinus rhythm with a rate of 100, normal intervals, normal axis, no ST elevations or depressions  05:09 -sinus rhythm with a rate of 76, normal intervals, normal axis, no ST elevations  or depressions. ____________________________________________  RADIOLOGY  I have personally reviewed the images performed during this visit and I agree with the Radiologist's read.   Interpretation by Radiologist:  DG Chest 2 View  Result Date: 12/30/2020 CLINICAL DATA:  Right chest pain EXAM: CHEST - 2 VIEW COMPARISON:  08/10/2019 FINDINGS: Lungs are clear.  No pleural effusion or pneumothorax. The heart is normal in size. Degenerative changes of the visualized thoracolumbar spine. IMPRESSION: Normal chest radiographs. Electronically Signed   By: Julian Hy M.D.   On: 12/30/2020 20:11   CT HEAD WO CONTRAST (5MM)  Result Date: 12/30/2020 CLINICAL DATA:  Facial numbness EXAM: CT HEAD WITHOUT CONTRAST TECHNIQUE: Contiguous axial images were obtained from the base of the skull through the vertex without intravenous contrast. COMPARISON:  08/10/2019 FINDINGS: Brain: No evidence of acute infarction, hemorrhage, hydrocephalus, extra-axial collection or mass lesion/mass effect. Vascular: No hyperdense vessel or unexpected calcification. Skull: Normal. Negative for fracture or focal lesion. Sinuses/Orbits: The visualized paranasal sinuses are essentially  clear. The mastoid air cells are unopacified. Other: None. IMPRESSION: Normal head CT. Electronically Signed   By: Julian Hy M.D.   On: 12/30/2020 20:14     ____________________________________________   PROCEDURES  Procedure(s) performed: yes .1-3 Lead EKG Interpretation Performed by: Rudene Re, MD Authorized by: Rudene Re, MD     Interpretation: normal     ECG rate assessment: normal     Rhythm: sinus rhythm     Ectopy: none     Conduction: normal     Critical Care performed:  None ____________________________________________   INITIAL IMPRESSION / ASSESSMENT AND PLAN / ED COURSE  55 y.o. female with a history of anxiety, hypertension, hyperlipidemia, obesity, panic disorder who presents for  evaluation of chest pain and numbness.  Patient describes an episode of feeling generalized pins and needle sensation in her face followed by feeling very anxious and developing sharp chest pain radiating to the jaw.  All her symptoms have resolved at this time.  She is completely neurologically intact.  The pins-and-needles sensation was bilaterally.  Patient does suffer from anxiety.  Just talking to me in the room patient told me that she was feeling extremely anxious just with my presence in the room.  She is otherwise extremely well-appearing in no distress with normal vital signs, completely neurologically intact, heart regular rate and rhythm with normal pulses in all 4 extremities.  EKG done on arrival and during my evaluation did not show any signs of dysrhythmias or ischemia.  Doubt stroke considering the symptoms were bilaterally but a CT head was done anyways and that was negative.  Troponin x2 negative with no signs of ischemia.  CBC and CMP with no acute abnormalities.  Patient was monitored on telemetry with no signs of dysrhythmias.  Unclear the cause of patient's paresthesias, recommended follow-up with PCP within normal work-up and exam at this time.  I do believe the patient's chest pain was most likely due to her anxiety but she does have a cardiologist and I recommended that she sees a cardiologist for Holter monitoring for these episodes of palpitations that she has been having.  Discussed standard return precautions for any signs of stroke, chest pain, dizziness, shortness of breath.      _____________________________________________ Please note:  Patient was evaluated in Emergency Department today for the symptoms described in the history of present illness. Patient was evaluated in the context of the global COVID-19 pandemic, which necessitated consideration that the patient might be at risk for infection with the SARS-CoV-2 virus that causes COVID-19. Institutional protocols and  algorithms that pertain to the evaluation of patients at risk for COVID-19 are in a state of rapid change based on information released by regulatory bodies including the CDC and federal and state organizations. These policies and algorithms were followed during the patient's care in the ED.  Some ED evaluations and interventions may be delayed as a result of limited staffing during the pandemic.   [place] Controlled Substance Database was reviewed by me. ____________________________________________   FINAL CLINICAL IMPRESSION(S) / ED DIAGNOSES   Final diagnoses:  Facial paresthesia  Chest pain, unspecified type  Fluttering sensation of heart      NEW MEDICATIONS STARTED DURING THIS VISIT:  ED Discharge Orders     None        Note:  This document was prepared using Dragon voice recognition software and may include unintentional dictation errors.    Rudene Re, MD 12/31/20 [phone]

## 2020-12-31 NOTE — Discharge Instructions (Signed)
As we discussed, your evaluation here did not show any acute abnormalities.  I would like for you to see Dr. Nehemiah Massed for a Holter monitoring for these episodes of palpitations.  Otherwise follow-up with your primary care doctor for further evaluation of the episodes of pins-and-needles in your face.  Return to the emergency room for chest pain, shortness of breath, facial droop, slurred speech, unilateral weakness or numbness, dizziness, or any other symptoms concerning to

## 2021-03-10 ENCOUNTER — Ambulatory Visit
Admission: RE | Admit: 2021-03-10 | Discharge: 2021-03-10 | Disposition: A | Discharge status: Home / Self Care | Payer: BC Managed Care – PPO | Source: Ambulatory Visit

## 2021-03-10 ENCOUNTER — Other Ambulatory Visit: Payer: Self-pay

## 2021-03-10 VITALS — BP 116/77 | HR 90 | Temp 98.6°F | Resp 18 | Wt 197.0 lb

## 2021-03-10 DIAGNOSIS — U071 COVID-19: Secondary | ICD-10-CM | POA: Diagnosis not present

## 2021-03-10 MED ORDER — AEROCHAMBER MV MISC: 2 refills | Status: AC

## 2021-03-10 MED ORDER — IPRATROPIUM BROMIDE 0.06 % NA SOLN: 2.0000 | Freq: Four times a day (QID) | NASAL | 12 refills | Status: DC

## 2021-03-10 MED ORDER — NIRMATRELVIR/RITONAVIR (PAXLOVID)TABLET: 3.0000 | ORAL_TABLET | Freq: Two times a day (BID) | ORAL | 0 refills | Status: AC

## 2021-03-10 MED ORDER — PROMETHAZINE-DM 6.25-15 MG/5ML PO SYRP: 5.0000 mL | ORAL_SOLUTION | Freq: Four times a day (QID) | ORAL | 0 refills | Status: DC | PRN

## 2021-03-10 MED ORDER — BENZONATATE 100 MG PO CAPS: 200.0000 mg | ORAL_CAPSULE | Freq: Three times a day (TID) | ORAL | 0 refills | Status: DC

## 2021-03-10 MED ORDER — ALBUTEROL SULFATE HFA 108 (90 BASE) MCG/ACT IN AERS: 2.0000 | INHALATION_SPRAY | RESPIRATORY_TRACT | 0 refills | Status: DC | PRN

## 2021-03-10 NOTE — Discharge Instructions (Addendum)
You will have to quarantine for 5 days from the start of your symptoms.  After 5 days you can break quarantine if your symptoms have improved and you have not had a fever for 24 hours without taking Tylenol or ibuprofen.  Use over-the-counter Tylenol and ibuprofen as needed for body aches and fever.  Use the Atrovent nasal spray, 2 squirts in each nostril every 6 hours, as needed for runny nose and postnasal drip.  Use the Tessalon Perles every 8 hours during the day.  Take them with a small sip of water.  They may give you some numbness to the base of your tongue or a metallic taste in your mouth, this is normal.  Use the Promethazine DM cough syrup at bedtime for cough and congestion.  It will make you drowsy so do not take it during the day.  Albuterol inhaler 2 puffs with spacer every 4-6 hours as needed for wheezing.  Return for reevaluation or see your primary care provider for any new or worsening symptoms.   If you develop any increased shortness of breath-especially at rest, you are unable to speak in full sentences, or is a late sign your lips are turning blue you need to go the ER for evaluation.

## 2021-03-10 NOTE — ED Triage Notes (Signed)
Appt @ 10am. Patient is here for "Cough". Started with "s/t, cough-dry, body aches, nausea, loose stools, low grade fever". Tested + for COVID19 @ home on "[id number]". Currently Cough "is bad" (increased at night), causing wheezing/sob. No fever presently.

## 2021-03-10 NOTE — ED Provider Notes (Signed)
[facility]    CSN: [id number] Arrival date & time: 03/10/21  1043      History   Chief Complaint Chief Complaint  Meagan Becker presents with   Cough    Appt 10am.    HPI SHEMICKA COHRS is a 55 y.o. female.   HPI  55 year old female here for evaluation of respiratory symptoms.  Meagan Becker reports that Meagan Becker developed her cold symptoms 4 days ago and that began with a sore throat, nonproductive cough, body aches, nausea, loose stools, and a low-grade fever.  Meagan Becker tested positive for COVID 2 days ago.  Meagan Becker is here for evaluation because Meagan Becker has had an increase in her cough and states that it started to cause her to have shortness of breath and wheezing.  Meagan Becker feels a tightness in her throat.  The cough is particularly bad at night.  Meagan Becker has not had any fevers at present.  Her husband also similar symptoms and he tested positive for COVID 2 days before Meagan Becker did.  He was treated with Paxlovid and is improving.  Past Medical History:  Diagnosis Date   Anxiety disorder    Anxiety state    Cellulitis    Headache    History of 2019 novel coronavirus disease (COVID-19) 03/12/2019   Hyperlipidemia    Hypertension    Menopausal symptom    Obesity    Pain, joint, ankle    Panic disorder    Retinal vascular changes    Sprain of neck    Stricture of ureter    Upper respiratory infection    Wheezing     Meagan Becker Active Problem List   Diagnosis Date Noted   Essential hypertension 02/20/2013   Chest discomfort 02/20/2013    Past Surgical History:  Procedure Laterality Date   ABDOMINAL HYSTERECTOMY     ANKLE SURGERY     BREAST ENHANCEMENT SURGERY     CHOLECYSTECTOMY     TONSILLECTOMY      OB History   No obstetric history on file.      Home Medications    Prior to Admission medications   Medication Sig Start Date End Date Taking? Authorizing Provider  albuterol (VENTOLIN HFA) 108 (90 Base) MCG/ACT inhaler Inhale 2 puffs into the lungs every 4 (four) hours as  needed. 03/10/21  Yes Margarette Canada, NP  benzonatate (TESSALON) 100 MG capsule Take 2 capsules (200 mg total) by mouth every 8 (eight) hours. 03/10/21  Yes Margarette Canada, NP  diazepam (VALIUM) 10 MG tablet Take 0.5 tablets by mouth daily as needed. 03/06/16  Yes [provider]  hydrochlorothiazide (HYDRODIURIL) 25 MG tablet Take 25 mg by mouth 2 (two) times daily. 02/28/21  Yes [provider]  ipratropium (ATROVENT) 0.06 % nasal spray Place 2 sprays into both nostrils 4 (four) times daily. 03/10/21  Yes Margarette Canada, NP  metoprolol succinate (TOPROL-XL) 25 MG 24 hr tablet Take 25 mg by mouth daily. 10/02/20  Yes [provider]  nirmatrelvir/ritonavir EUA (PAXLOVID) 20 x 150 MG & 10 x 100MG  TABS Take 3 tablets by mouth 2 (two) times daily for 5 days. Meagan Becker GFR is >60. Take nirmatrelvir (150 mg) two tablets twice daily for 5 days and ritonavir (100 mg) one tablet twice daily for 5 days. 03/10/21 03/15/21 Yes Margarette Canada, NP  promethazine-dextromethorphan (PROMETHAZINE-DM) 6.25-15 MG/5ML syrup Take 5 mLs by mouth 4 (four) times daily as needed. 03/10/21  Yes Margarette Canada, NP  Spacer/Aero-Holding Chambers (AEROCHAMBER MV) inhaler Use as instructed 03/10/21  Yes Margarette Canada, NP  valsartan (DIOVAN) 160 MG tablet Take 160 mg by mouth daily. 01/02/21  Yes [provider]    Family History Family History  Problem Relation Age of Onset   Hypercholesterolemia Mother    Hypertension Mother    Heart disease Father    Hypertension Father    Cancer Father    Cancer Other     Social History Social History   Tobacco Use   Smoking status: Never   Smokeless tobacco: Never  Vaping Use   Vaping Use: Never used  Substance Use Topics   Alcohol use: Not Currently   Drug use: Not Currently    Types: Marijuana    Comment: past - last use 3-4 years ago     Allergies   Meagan Becker has no known allergies.   Review of Systems Review of Systems  Constitutional:   Positive for fatigue and fever. Negative for activity change and appetite change.  HENT:  Positive for congestion, postnasal drip, rhinorrhea and sore throat. Negative for ear pain.   Respiratory:  Positive for cough, shortness of breath and wheezing.   Gastrointestinal:  Positive for diarrhea. Negative for nausea and vomiting.  Musculoskeletal:  Positive for arthralgias and myalgias.  Skin:  Negative for rash.  Hematological: Negative.   Psychiatric/Behavioral: Negative.      Physical Exam Triage Vital Signs ED Triage Vitals  Enc Vitals Group     BP 03/10/21 1056 116/77     Pulse Rate 03/10/21 1056 90     Resp 03/10/21 1056 18     Temp 03/10/21 1056 98.6 F (37 C)     Temp Source 03/10/21 1056 Oral     SpO2 03/10/21 1056 99 %     Weight 03/10/21 1055 197 lb (89.4 kg)     Height --      Head Circumference --      Peak Flow --      Pain Score 03/10/21 1055 0     Pain Loc --      Pain Edu? --      Excl. in [place]? --    No data found.  Updated Vital Signs BP 116/77 (BP Location: Left Arm)    Pulse 90    Temp 98.6 F (37 C) (Oral)    Resp 18    Wt 197 lb (89.4 kg)    SpO2 99%    BMI 34.90 kg/m   Visual Acuity Right Eye Distance:   Left Eye Distance:   Bilateral Distance:    Right Eye Near:   Left Eye Near:    Bilateral Near:     Physical Exam Vitals and nursing note reviewed.  Constitutional:      Appearance: Meagan Becker is ill-appearing.  HENT:     Head: Normocephalic and atraumatic.     Right Ear: Tympanic membrane, ear canal and external ear normal. There is no impacted cerumen.     Left Ear: Tympanic membrane, ear canal and external ear normal. There is no impacted cerumen.     Nose: Congestion and rhinorrhea present.     Mouth/Throat:     Mouth: Mucous membranes are moist.     Pharynx: Oropharynx is clear. Posterior oropharyngeal erythema present.  Cardiovascular:     Rate and Rhythm: Normal rate and regular rhythm.     Pulses: Normal pulses.     Heart sounds:  Normal heart sounds. No murmur heard.   No gallop.  Pulmonary:     Effort: Pulmonary  effort is normal.     Breath sounds: No wheezing, rhonchi or rales.  Musculoskeletal:     Cervical back: Normal range of motion and neck supple.  Lymphadenopathy:     Cervical: No cervical adenopathy.  Skin:    General: Skin is warm and dry.     Capillary Refill: Capillary refill takes less than 2 seconds.     Findings: No erythema or rash.  Neurological:     General: No focal deficit present.     Mental Status: Meagan Becker is alert and oriented to person, place, and time.  Psychiatric:        Mood and Affect: Mood normal.        Behavior: Behavior normal.        Thought Content: Thought content normal.        Judgment: Judgment normal.     UC Treatments / Results  Labs (all labs ordered are listed, but only abnormal results are displayed) Labs Reviewed - No data to display  EKG   Radiology No results found.  Procedures Procedures (including critical care time)  Medications Ordered in UC Medications - No data to display  Initial Impression / Assessment and Plan / UC Course  I have reviewed the triage vital signs and the nursing notes.  Pertinent labs & imaging results that were available during my care of the Meagan Becker were reviewed by me and considered in my medical decision making (see chart for details).  Meagan Becker is a very pleasant though ill-appearing 56 year old female here for evaluation of respiratory symptoms that been present for last 4 days.  Meagan Becker tested positive for COVID 2 days ago.  Meagan Becker has a history of hypertension, obesity, and anxiety.  No history of pulmonary issues.  On exam Meagan Becker has pearly gray tympanic membranes bilaterally with normal light reflex and clear external auditory canals.  Nasal mucosa is erythematous and edematous with clear nasal discharge in both nares.  Oropharyngeal exam reveals posterior oropharyngeal erythema with clear postnasal drip.  No injection noted.   Tonsillar pillars are surgically absent.  No cervical lymphadenopathy appreciated on exam.  Cardiopulmonary exam reveals clear lung sounds in all fields.  Meagan Becker is a CMP in the system from October of this year that shows a GFR greater than 60.  We will treat her with Paxlovid twice daily for 5 days.  I will also prescribe an albuterol inhaler and spacer for Meagan Becker to use for shortness breath or wheezing, Tessalon Perles and Promethazine DM cough syrup for cough and congestion, and Atrovent nasal spray.  ER and return precautions reviewed with Meagan Becker.   Final Clinical Impressions(s) / UC Diagnoses   Final diagnoses:  [id number]     Discharge Instructions      You will have to quarantine for 5 days from the start of your symptoms.  After 5 days you can break quarantine if your symptoms have improved and you have not had a fever for 24 hours without taking Tylenol or ibuprofen.  Use over-the-counter Tylenol and ibuprofen as needed for body aches and fever.  Use the Atrovent nasal spray, 2 squirts in each nostril every 6 hours, as needed for runny nose and postnasal drip.  Use the Tessalon Perles every 8 hours during the day.  Take them with a small sip of water.  They may give you some numbness to the base of your tongue or a metallic taste in your mouth, this is normal.  Use the Promethazine DM cough syrup at bedtime for  cough and congestion.  It will make you drowsy so do not take it during the day.  Albuterol inhaler 2 puffs with spacer every 4-6 hours as needed for wheezing.  Return for reevaluation or see your primary care provider for any new or worsening symptoms.   If you develop any increased shortness of breath-especially at rest, you are unable to speak in full sentences, or is a late sign your lips are turning blue you need to go the ER for evaluation.      ED Prescriptions     Medication Sig Dispense Auth. Provider   nirmatrelvir/ritonavir EUA (PAXLOVID) 20 x 150 MG &  10 x 100MG  TABS Take 3 tablets by mouth 2 (two) times daily for 5 days. Meagan Becker GFR is >60. Take nirmatrelvir (150 mg) two tablets twice daily for 5 days and ritonavir (100 mg) one tablet twice daily for 5 days. 30 tablet Margarette Canada, NP   benzonatate (TESSALON) 100 MG capsule Take 2 capsules (200 mg total) by mouth every 8 (eight) hours. 21 capsule Margarette Canada, NP   ipratropium (ATROVENT) 0.06 % nasal spray Place 2 sprays into both nostrils 4 (four) times daily. 15 mL Margarette Canada, NP   promethazine-dextromethorphan (PROMETHAZINE-DM) 6.25-15 MG/5ML syrup Take 5 mLs by mouth 4 (four) times daily as needed. 118 mL Margarette Canada, NP   albuterol (VENTOLIN HFA) 108 (90 Base) MCG/ACT inhaler Inhale 2 puffs into the lungs every 4 (four) hours as needed. 18 g Margarette Canada, NP   Spacer/Aero-Holding Chambers (AEROCHAMBER MV) inhaler Use as instructed 1 each Margarette Canada, NP      PDMP not reviewed this encounter.   Margarette Canada, NP 03/10/21 1134

## 2021-10-08 ENCOUNTER — Encounter: Payer: Self-pay | Admitting: Emergency Medicine

## 2021-10-08 ENCOUNTER — Ambulatory Visit
Admission: EM | Admit: 2021-10-08 | Discharge: 2021-10-08 | Disposition: A | Discharge status: Home / Self Care | Payer: BC Managed Care – PPO | Attending: Emergency Medicine | Admitting: Emergency Medicine

## 2021-10-08 DIAGNOSIS — H5712 Ocular pain, left eye: Secondary | ICD-10-CM | POA: Diagnosis not present

## 2021-10-08 MED ORDER — AMOXICILLIN-POT CLAVULANATE 875-125 MG PO TABS: 1.0000 | ORAL_TABLET | Freq: Two times a day (BID) | ORAL | 0 refills | Status: DC

## 2021-10-08 NOTE — ED Provider Notes (Signed)
HPI  SUBJECTIVE:  Meagan Becker is a 56 y.o. female who presents with 2 days of intermittent, seconds long periorbital pain described as ache, pressure.  She reports pain with lateral gaze.  She reports tender swelling underneath her eye, increased watering starting last night.  States that her eye feels dry, scratchy with photophobia today.  She reports blurry vision when her eye waters, but otherwise her vision is normal.  She states that her eye will twitch, burn, and then water.  No fevers, headaches, nasal congestion, rhinorrhea, allergy symptoms, recent sinusitis, foreign body sensation conjunctival injection, purulent drainage.  She denies rubbing her eye or recent trauma.  she wears contacts and glasses.  These are relatively new prescriptions.  When she wears her glasses, her vision is equal.  She has not tried anything for this.  Symptoms are worse with lights and with moving her eye.  No alleviating factors.  She has a past medical history of hypertension.  No history of diabetes, glaucoma, MRSA.  Ophthalmology: [facility].  She has been seeing them over the past 2 months for twitching/blurry vision for the past 2 months.,  However this eye pain and infraorbital swelling is new.   Past Medical History:  Diagnosis Date   Anxiety disorder    Anxiety state    Cellulitis    Headache    History of 2019 novel coronavirus disease (COVID-19) 03/12/2019   Hyperlipidemia    Hypertension    Menopausal symptom    Obesity    Pain, joint, ankle    Panic disorder    Retinal vascular changes    Sprain of neck    Stricture of ureter    Upper respiratory infection    Wheezing     Past Surgical History:  Procedure Laterality Date   ABDOMINAL HYSTERECTOMY     ANKLE SURGERY     BREAST ENHANCEMENT SURGERY     CHOLECYSTECTOMY     TONSILLECTOMY      Family History  Problem Relation Age of Onset   Hypercholesterolemia Mother    Hypertension Mother    Heart disease Father     Hypertension Father    Cancer Father    Cancer Other     Social History   Tobacco Use   Smoking status: Never   Smokeless tobacco: Never  Vaping Use   Vaping Use: Never used  Substance Use Topics   Alcohol use: Not Currently   Drug use: Not Currently    Types: Marijuana    Comment: past - last use 3-4 years ago    No current facility-administered medications for this encounter.  Current Outpatient Medications:    amoxicillin-clavulanate (AUGMENTIN) 875-125 MG tablet, Take 1 tablet by mouth every 12 (twelve) hours., Disp: 14 tablet, Rfl: 0   hydrochlorothiazide (HYDRODIURIL) 25 MG tablet, Take 25 mg by mouth 2 (two) times daily., Disp: , Rfl:    metoprolol succinate (TOPROL-XL) 25 MG 24 hr tablet, Take 25 mg by mouth daily., Disp: , Rfl:    albuterol (VENTOLIN HFA) 108 (90 Base) MCG/ACT inhaler, Inhale 2 puffs into the lungs every 4 (four) hours as needed., Disp: 18 g, Rfl: 0   benzonatate (TESSALON) 100 MG capsule, Take 2 capsules (200 mg total) by mouth every 8 (eight) hours., Disp: 21 capsule, Rfl: 0   diazepam (VALIUM) 10 MG tablet, Take 0.5 tablets by mouth daily as needed., Disp: , Rfl:    ipratropium (ATROVENT) 0.06 % nasal spray, Place 2 sprays into both nostrils  4 (four) times daily., Disp: 15 mL, Rfl: 12   promethazine-dextromethorphan (PROMETHAZINE-DM) 6.25-15 MG/5ML syrup, Take 5 mLs by mouth 4 (four) times daily as needed., Disp: 118 mL, Rfl: 0   Spacer/Aero-Holding Chambers (AEROCHAMBER MV) inhaler, Use as instructed, Disp: 1 each, Rfl: 2   valsartan (DIOVAN) 160 MG tablet, Take 160 mg by mouth daily., Disp: , Rfl:   No Known Allergies   ROS  As noted in HPI.   Physical Exam  BP (!) 141/81 (BP Location: Left Wrist)   Pulse 92   Temp 98.6 F (37 C) (Oral)   Resp 16   SpO2 97%   Constitutional: Well developed, well nourished, no acute distress Eyes:  EOMI, conjunctiva normal bilaterally.  PERRLA.  Pain with medial and lateral gaze.  Positive direct and  consensual photophobia.  No foreign body seen on lid eversion.  No corneal abrasion seen on fluorescein exam.  Positive periorbital tenderness.  Positive infraorbital swelling.  No erythema.  No proptosis.    Visual Acuity  Right Eye Distance: 20/25 (corrected) Left Eye Distance: 20/40 (corrected) Bilateral Distance: 20/20 (corrected)  Right Eye Near:   Left Eye Near:    Bilateral Near:          HENT: Normocephalic, atraumatic,mucus membranes moist.  No nasal congestion. Respiratory: Normal inspiratory effort Cardiovascular: Normal rate GI: nondistended skin: No rash, skin intact Musculoskeletal: no deformities Neurologic: Alert & oriented x 3, no focal neuro deficits Psychiatric: Speech and behavior appropriate   ED Course   Medications - No data to display  No orders of the defined types were placed in this encounter.   No results found for this or any previous visit (from the past 24 hour(s)). No results found.  ED Clinical Impression  1. Eye pain, left      ED Assessment/Plan  Discussed case with Dr. Lazarus Salines, ophthalmology on-call.  This could be a very early periorbital cellulitis or an incoming stye.  Doubt postseptal cellulitis.  Will send home with Augmentin, cool compresses.  He will also see her today or tomorrow morning.  Patient would like to be seen today.  Advised her to call his office and arrange follow-up.    Meds ordered this encounter  Medications   amoxicillin-clavulanate (AUGMENTIN) 875-125 MG tablet    Sig: Take 1 tablet by mouth every 12 (twelve) hours.    Dispense:  14 tablet    Refill:  0      *This clinic note was created using [employer]. Therefore, there may be occasional mistakes despite careful proofreading.  ?    Melynda Ripple, MD 10/08/21 [phone]

## 2021-10-08 NOTE — ED Triage Notes (Signed)
Pt presents with left eye redness, drainage, some blurred vision and swelling that started this morning. Pt does report having issues with her left eye for the past 2 months with twitching she has been seen at the ophthalmologist, but this eye problem is new

## 2021-10-08 NOTE — Discharge Instructions (Addendum)
Call [facility] so that you can be seen by them today or tomorrow.  You can tell them that the provider at the urgent care spoke with Dr. Lazarus Salines.  I am writing a prescription of Augmentin in case this is a very early periorbital cellulitis.

## 2021-11-22 ENCOUNTER — Other Ambulatory Visit: Payer: Self-pay

## 2021-11-22 ENCOUNTER — Emergency Department: Payer: BC Managed Care – PPO

## 2021-11-22 ENCOUNTER — Emergency Department
Admission: EM | Admit: 2021-11-22 | Discharge: 2021-11-22 | Disposition: A | Discharge status: Home / Self Care | Payer: BC Managed Care – PPO | Attending: Emergency Medicine | Admitting: Emergency Medicine

## 2021-11-22 ENCOUNTER — Encounter: Payer: Self-pay | Admitting: Emergency Medicine

## 2021-11-22 DIAGNOSIS — R519 Headache, unspecified: Secondary | ICD-10-CM | POA: Diagnosis present

## 2021-11-22 DIAGNOSIS — R200 Anesthesia of skin: Secondary | ICD-10-CM | POA: Diagnosis not present

## 2021-11-22 DIAGNOSIS — R253 Fasciculation: Secondary | ICD-10-CM | POA: Insufficient documentation

## 2021-11-22 DIAGNOSIS — I10 Essential (primary) hypertension: Secondary | ICD-10-CM | POA: Insufficient documentation

## 2021-11-22 DIAGNOSIS — Z8616 Personal history of COVID-19: Secondary | ICD-10-CM | POA: Insufficient documentation

## 2021-11-22 LAB — CBC
HCT: 39.6 % (ref 36.0–46.0)
Hemoglobin: 13.2 g/dL (ref 12.0–15.0)
MCH: 29.5 pg (ref 26.0–34.0)
MCHC: 33.3 g/dL (ref 30.0–36.0)
MCV: 88.4 fL (ref 80.0–100.0)
Platelets: 385 10*3/uL (ref 150–400)
RBC: 4.48 MIL/uL (ref 3.87–5.11)
RDW: 12.3 % (ref 11.5–15.5)
WBC: 7.3 10*3/uL (ref 4.0–10.5)
nRBC: 0 % (ref 0.0–0.2)

## 2021-11-22 LAB — URINALYSIS, ROUTINE W REFLEX MICROSCOPIC
Bilirubin Urine: NEGATIVE
Glucose, UA: NEGATIVE mg/dL
Hgb urine dipstick: NEGATIVE
Ketones, ur: NEGATIVE mg/dL
Leukocytes,Ua: NEGATIVE
Nitrite: NEGATIVE
Protein, ur: NEGATIVE mg/dL
Specific Gravity, Urine: 1.023 (ref 1.005–1.030)
pH: 5 (ref 5.0–8.0)

## 2021-11-22 LAB — BASIC METABOLIC PANEL
Anion gap: 11 (ref 5–15)
BUN: 17 mg/dL (ref 6–20)
CO2: 24 mmol/L (ref 22–32)
Calcium: 9.4 mg/dL (ref 8.9–10.3)
Chloride: 102 mmol/L (ref 98–111)
Creatinine, Ser: 0.94 mg/dL (ref 0.44–1.00)
GFR, Estimated: >60 mL/min (ref >60)
Glucose, Bld: 188 mg/dL — ABNORMAL HIGH (ref 70–99)
Potassium: 3.2 mmol/L — ABNORMAL LOW (ref 3.5–5.1)
Sodium: 137 mmol/L (ref 135–145)

## 2021-11-22 MED ORDER — KETOROLAC TROMETHAMINE 15 MG/ML IJ SOLN
15.0000 mg | Freq: Once | INTRAMUSCULAR | Status: AC
  Administered 2021-11-22: 15 mg via INTRAMUSCULAR
  Filled 2021-11-22: qty 1

## 2021-11-22 MED ORDER — METOCLOPRAMIDE HCL 10 MG PO TABS
10.0000 mg | ORAL_TABLET | Freq: Once | ORAL | Status: AC
  Administered 2021-11-22: 10 mg via ORAL
  Filled 2021-11-22: qty 1

## 2021-11-22 MED ORDER — ACETAMINOPHEN 500 MG PO TABS
1000.0000 mg | ORAL_TABLET | Freq: Once | ORAL | Status: AC
  Administered 2021-11-22: 1000 mg via ORAL
  Filled 2021-11-22: qty 2

## 2021-11-22 NOTE — Discharge Instructions (Signed)
I suspect that your facial pain is related to migraine headache.  Follow-up with either your neurologist or Dr. Melrose Nakayama for further evaluation.  You to take the Excedrin Migraine.  Please return the emergency department if you develop any new numbness tingling weakness or vision loss.

## 2021-11-22 NOTE — ED Provider Notes (Signed)
[name] Provider Note    Event Date/Time   First MD Initiated Contact with Patient 11/22/21 1057     (approximate)   History   Numbness   HPI  Meagan Becker is a 56 y.o. female  with pmh anxiety who presents with head pain. Patient notes that for several months she has had L eye twitching, occasional pain, and watering. She has seen an opthalmologic for this and was told that it was possibly an ocular migraine. IOP and rest of exam normal per patient report. Sine this past Wed, she has had unilateral L sided facial/head pain.  She describes it as a tingling sensation that involves the left side of her head and face.  Occasionally radiates around to the right side of her head as well and feels like a gripping pinching feeling.  Since Wednesday this has been coming and going.  When she moves her jaw she does feel occasional popping and that seems to bring on the tingling.  She denies associated visual change diplopia dysarthria numbness tingling weakness in any other part of her body.  Denies any trauma to the head or neck.     Past Medical History:  Diagnosis Date   Anxiety disorder    Anxiety state    Cellulitis    Headache    History of 2019 novel coronavirus disease (COVID-19) 03/12/2019   Hyperlipidemia    Hypertension    Menopausal symptom    Obesity    Pain, joint, ankle    Panic disorder    Retinal vascular changes    Sprain of neck    Stricture of ureter    Upper respiratory infection    Wheezing     Patient Active Problem List   Diagnosis Date Noted   Essential hypertension 02/20/2013   Chest discomfort 02/20/2013     Physical Exam  Triage Vital Signs: ED Triage Vitals  Enc Vitals Group     BP 11/22/21 0937 (!) 146/88     Pulse Rate 11/22/21 0937 92     Resp 11/22/21 0937 18     Temp 11/22/21 0937 97.8 F (36.6 C)     Temp Source 11/22/21 0937 Oral     SpO2 11/22/21 0937 97 %     Weight 11/22/21 0938 197 lb 1.5 oz  (89.4 kg)     Height 11/22/21 0938 '5\' 3"'$  (1.6 m)     Head Circumference --      Peak Flow --      Pain Score 11/22/21 0938 6     Pain Loc --      Pain Edu? --      Excl. in [place]? --     Most recent vital signs: Vitals:   11/22/21 0937  BP: (!) 146/88  Pulse: 92  Resp: 18  Temp: 97.8 F (36.6 C)  SpO2: 97%     General: Awake, no distress.  CV:  Good peripheral perfusion.  Resp:  Normal effort.  Abd:  No distention.  Neuro:             Awake, Alert, Oriented x 3  Other:  Aox3, nml speech  PERRL, EOMI, face symmetric, nml tongue movement  5/5 strength in the BL upper and lower extremities  Sensation grossly intact in the BL upper and lower extremities  Finger-nose-finger intact BL  Conjunctiva are clear BL eyes, PERRLA, EOMI, no pain with EOM    ED Results / Procedures / Treatments  Labs (  all labs ordered are listed, but only abnormal results are displayed) Labs Reviewed  BASIC METABOLIC PANEL - Abnormal; Notable for the following components:      Result Value   Potassium 3.2 (*)    Glucose, Bld 188 (*)    All other components within normal limits  URINALYSIS, ROUTINE W REFLEX MICROSCOPIC - Abnormal; Notable for the following components:   Color, Urine YELLOW (*)    APPearance HAZY (*)    All other components within normal limits  CBC  CBG MONITORING, ED  POC URINE PREG, ED     EKG  EKG reviewed and interpreted myself shows normal sinus rhythm normal axis normal intervals inferior T wave inversions and Q waves   RADIOLOGY I reviewed and interpreted the CT scan of the brain which does not show any acute intracranial process    PROCEDURES:  Critical Care performed: No  Procedures  The patient is on the cardiac monitor to evaluate for evidence of arrhythmia and/or significant heart rate changes.   MEDICATIONS ORDERED IN ED: Medications  ketorolac (TORADOL) 15 MG/ML injection 15 mg (15 mg Intramuscular Given 11/22/21 1144)  metoCLOPramide (REGLAN)  tablet 10 mg (10 mg Oral Given 11/22/21 1142)  acetaminophen (TYLENOL) tablet 1,000 mg (1,000 mg Oral Given 11/22/21 1142)     IMPRESSION / MDM / ASSESSMENT AND PLAN / ED COURSE  I reviewed the triage vital signs and the nursing notes.                              Patient's presentation is most consistent with acute complicated illness / injury requiring diagnostic workup.  Differential diagnosis includes, but is not limited to, migraine headache, trigeminal neuralgia, TMJ disorder, demyelinating disorder, intracranial mass, CVA, TIA   The patient is a 55 year old female presents with left-sided facial pain/headache.  She has had issues with the left eye for several months now has been evaluated by ophthalmology and has had several ED visits for this.  Primary symptoms are left eye twitching watering occasional left eye pain.  Now in addition to the symptoms she has had left-sided facial pain for about 5 days.  Feels a tingling sensation but no weakness.  No visual change diplopia numbness or weakness in any other part of her body.  Denies trauma.  Her exam is rather benign.  Sensation on the face is intact she has no facial droop extraocular movements are intact and she has no abnormal pupillary findings.  No skin rash to suggest zoster.  Differential is as above.  CT head was obtained from triage and is negative.  Patient is that this is either a atypical migraine headache or trigeminal neuralgia/TMJ related.  I have low suspicion for more serious underlying pathology including brain mass or cervical artery dissection given her normal neurologic exam.  I do not think this is a stroke given it is painful and that the symptoms have been waxing and waning for about 5 days.  Considered temporal arteritis but patient is rather young for this diagnosis she has no jaw claudication no vision loss feel this is less likely.  Plan to treat as migraine with migraine cocktail and reassess.  Toradol Reglan Tylenol  patient feeling improved.  I referred her to neurology.  She is appropriate for discharge.       FINAL CLINICAL IMPRESSION(S) / ED DIAGNOSES   Final diagnoses:  Facial pain     Rx / DC Orders  ED Discharge Orders     None        Note:  This document was prepared using Dragon voice recognition software and may include unintentional dictation errors.   Rada Hay, MD 11/22/21 [phone]

## 2021-11-22 NOTE — ED Triage Notes (Signed)
Pt here with facial numbness x4 days. Pt states it started off as a twitch in her left eye but has progressed to numbness and pain along the left of her head. Pt denies dizziness, N, V or D.

## 2022-01-07 ENCOUNTER — Other Ambulatory Visit: Payer: Self-pay | Admitting: Neurology

## 2022-01-07 DIAGNOSIS — I639 Cerebral infarction, unspecified: Secondary | ICD-10-CM

## 2022-02-10 ENCOUNTER — Ambulatory Visit
Admission: RE | Admit: 2022-02-10 | Discharge: 2022-02-10 | Disposition: A | Discharge status: Home / Self Care | Payer: BC Managed Care – PPO | Source: Ambulatory Visit | Attending: Neurology | Admitting: Neurology

## 2022-02-10 DIAGNOSIS — I639 Cerebral infarction, unspecified: Secondary | ICD-10-CM

## 2022-02-13 ENCOUNTER — Ambulatory Visit
Admission: RE | Admit: 2022-02-13 | Discharge: 2022-02-13 | Disposition: A | Discharge status: Home / Self Care | Payer: BC Managed Care – PPO | Source: Ambulatory Visit | Attending: Neurology | Admitting: Neurology

## 2022-04-20 ENCOUNTER — Ambulatory Visit (INDEPENDENT_AMBULATORY_CARE_PROVIDER_SITE_OTHER): Payer: BC Managed Care – PPO

## 2022-04-20 ENCOUNTER — Ambulatory Visit
Admission: EM | Admit: 2022-04-20 | Discharge: 2022-04-20 | Disposition: A | Discharge status: Home / Self Care | Payer: BC Managed Care – PPO | Attending: Family Medicine | Admitting: Family Medicine

## 2022-04-20 DIAGNOSIS — S92355A Nondisplaced fracture of fifth metatarsal bone, left foot, initial encounter for closed fracture: Secondary | ICD-10-CM

## 2022-04-20 DIAGNOSIS — S60222A Contusion of left hand, initial encounter: Secondary | ICD-10-CM

## 2022-04-20 DIAGNOSIS — S82832A Other fracture of upper and lower end of left fibula, initial encounter for closed fracture: Secondary | ICD-10-CM | POA: Diagnosis not present

## 2022-04-20 DIAGNOSIS — W19XXXA Unspecified fall, initial encounter: Secondary | ICD-10-CM

## 2022-04-20 DIAGNOSIS — M79642 Pain in left hand: Secondary | ICD-10-CM

## 2022-04-20 MED ORDER — TRAMADOL HCL 50 MG PO TABS: 50.0000 mg | ORAL_TABLET | Freq: Four times a day (QID) | ORAL | 0 refills | Status: AC | PRN

## 2022-04-20 NOTE — ED Triage Notes (Addendum)
Pt states she fell out of front door  this morning and landed side ways onto LT ankle DOI: 04/20/22. Pt does report she has had previous issues with this ankle, previous fracture & surgery

## 2022-04-20 NOTE — Discharge Instructions (Addendum)
Follow up with Dr. Rosana Hoes regarding your fractures.  Use your crutches and walking boot.  Take Tramadol and Motrin as needed for pain. Stop by the pharmacy to pick up your prescriptions.

## 2022-04-20 NOTE — ED Provider Notes (Addendum)
[facility]    CSN: [id number] Arrival date & time: 04/20/22  0813      History   Chief Complaint Chief Complaint  Patient presents with   Ankle Injury    LT ankle     HPI  HPI Meagan Becker is a 57 y.o. female.   Meagan Becker presents for left ankle pain after a fall at home this morning. She took Advil prior to arrival.  Husband states she rolled her ankle then stepped on it. Reports  immediate pain in her ankle . Did hear some pop and abnormal sounds with her injury.  Continues to have throbbing pain with movement. She has bruising to her left hand. She is left handed.    Past Medical History:  Diagnosis Date   Anxiety disorder    Anxiety state    Cellulitis    Headache    History of 2019 novel coronavirus disease (COVID-19) 03/12/2019   Hyperlipidemia    Hypertension    Menopausal symptom    Obesity    Pain, joint, ankle    Panic disorder    Retinal vascular changes    Sprain of neck    Stricture of ureter    Upper respiratory infection    Wheezing     Patient Active Problem List   Diagnosis Date Noted   Essential hypertension 02/20/2013   Chest discomfort 02/20/2013    Past Surgical History:  Procedure Laterality Date   ABDOMINAL HYSTERECTOMY     ANKLE SURGERY     BREAST ENHANCEMENT SURGERY     CHOLECYSTECTOMY     TONSILLECTOMY      OB History   No obstetric history on file.      Home Medications    Prior to Admission medications   Medication Sig Start Date End Date Taking? Authorizing Provider  traMADol (ULTRAM) 50 MG tablet Take 1-2 tablets (50-100 mg total) by mouth every 6 (six) hours as needed for up to 3 days. 04/20/22 04/23/22 Yes Vaniyah Lansky, DO  albuterol (VENTOLIN HFA) 108 (90 Base) MCG/ACT inhaler Inhale 2 puffs into the lungs every 4 (four) hours as needed. 03/10/21   Margarette Canada, NP  diazepam (VALIUM) 10 MG tablet Take 0.5 tablets by mouth daily as needed. 03/06/16   [provider]  hydrochlorothiazide  (HYDRODIURIL) 25 MG tablet Take 25 mg by mouth 2 (two) times daily. 02/28/21   [provider]  metoprolol succinate (TOPROL-XL) 25 MG 24 hr tablet Take 25 mg by mouth daily. 10/02/20   [provider]  Spacer/Aero-Holding Chambers (AEROCHAMBER MV) inhaler Use as instructed 03/10/21   Margarette Canada, NP  valsartan (DIOVAN) 160 MG tablet Take 160 mg by mouth daily. 01/02/21   [provider]    Family History Family History  Problem Relation Age of Onset   Hypercholesterolemia Mother    Hypertension Mother    Heart disease Father    Hypertension Father    Cancer Father    Cancer Other     Social History Social History   Tobacco Use   Smoking status: Never   Smokeless tobacco: Never  Vaping Use   Vaping Use: Never used  Substance Use Topics   Alcohol use: Not Currently   Drug use: Not Currently    Types: Marijuana    Comment: past - last use 3-4 years ago     Allergies   Hydrocodone-acetaminophen   Review of Systems Review of Systems: :negative unless otherwise stated in HPI.  Physical Exam Triage Vital Signs ED Triage Vitals  Enc Vitals Group     BP      Pulse      Resp      Temp      Temp src      SpO2      Weight      Height      Head Circumference      Peak Flow      Pain Score      Pain Loc      Pain Edu?      Excl. in [place]?    No data found.  Updated Vital Signs BP (!) 151/79 (BP Location: Right Arm)   Pulse 94   Temp 98.2 F (36.8 C) (Oral)   Ht '5\' 3"'$  (1.6 m)   Wt 97.5 kg   SpO2 99%   BMI 38.09 kg/m   Visual Acuity Right Eye Distance:   Left Eye Distance:   Bilateral Distance:    Right Eye Near:   Left Eye Near:    Bilateral Near:     Physical Exam GEN: uncomfortable appearing female in no acute distress  CVS: well perfused, regular rate RESP: speaking in full sentences without pause, no respiratory distress  MSK:  Left hand: hematoma overlying 3rd and 4th metatarsal, no palmer tenderness, 5/5 grip  strength  Left ankle: Inspection: No erythema, + mild edema and ecchymosis, no appreciable bony deformity, transverse arch intact Palpation: Tenderness of the lateral talocalcaneal joint ROM: limited range of motion due to acute pain Strength: not assessed  + pain at the base of the fifth metatarsal Not able to ambulate without pain  Special Tests: anterior and posterior drawer negative, negative calcaneal squeeze No fibular head tenderness  Neurovascularly intact, no instability noted    UC Treatments / Results  Labs (all labs ordered are listed, but only abnormal results are displayed) Labs Reviewed - No data to display  EKG   Radiology DG Hand Complete Left  Result Date: 04/20/2022 CLINICAL DATA:  Fall with left hand pain. EXAM: LEFT HAND - COMPLETE 3 VIEW COMPARISON:  06/14/2018 FINDINGS: There is no evidence of fracture or dislocation. Tiny soft tissue calcification near the middle finger D IP joint is stable. Non worrisome lucency at the shaft of the first metacarpal is also stable. No erosive changes. IMPRESSION: No acute finding or change from 2020. Electronically Signed   By: Jorje Guild M.D.   On: 04/20/2022 09:54   DG Ankle Complete Left  Result Date: 04/20/2022 CLINICAL DATA:  [place] out the front door this morning landing onto left ankle. History of prior left ankle and foot fracture and surgery. EXAM: LEFT ANKLE COMPLETE - 3+ VIEW; LEFT FOOT - COMPLETE 3+ VIEW COMPARISON:  Left ankle radiographs 01/17/2005, MRI left ankle 12/12/2008 FINDINGS: Left ankle: There is curvilinear lucency at the distal tip of the fibula. This is similar to the location of the remote nondisplaced avulsion fracture on 01/17/2005 radiographs, however is in a slightly different configuration and may represent a new nondisplaced acute avulsion fracture. There is mild diffuse ankle soft tissue swelling. The ankle mortise is symmetric and intact. Left foot: Small plantar and posterior calcaneal heel  spurs. There are two linear lucencies within the base of the fifth metatarsal indicating an acute nondisplaced fracture without definite intra-articular extension. IMPRESSION: 1. Acute nondisplaced fracture of the base of the fifth metatarsal. 2. Probable acute nondisplaced avulsion fracture of the distal tip of the fibula. This is  in a similar region as the prior remote 2006 avulsion fracture. Electronically Signed   By: Yvonne Kendall M.D.   On: 04/20/2022 09:00   DG Foot Complete Left  Result Date: 04/20/2022 CLINICAL DATA:  [place] out the front door this morning landing onto left ankle. History of prior left ankle and foot fracture and surgery. EXAM: LEFT ANKLE COMPLETE - 3+ VIEW; LEFT FOOT - COMPLETE 3+ VIEW COMPARISON:  Left ankle radiographs 01/17/2005, MRI left ankle 12/12/2008 FINDINGS: Left ankle: There is curvilinear lucency at the distal tip of the fibula. This is similar to the location of the remote nondisplaced avulsion fracture on 01/17/2005 radiographs, however is in a slightly different configuration and may represent a new nondisplaced acute avulsion fracture. There is mild diffuse ankle soft tissue swelling. The ankle mortise is symmetric and intact. Left foot: Small plantar and posterior calcaneal heel spurs. There are two linear lucencies within the base of the fifth metatarsal indicating an acute nondisplaced fracture without definite intra-articular extension. IMPRESSION: 1. Acute nondisplaced fracture of the base of the fifth metatarsal. 2. Probable acute nondisplaced avulsion fracture of the distal tip of the fibula. This is in a similar region as the prior remote 2006 avulsion fracture. Electronically Signed   By: Yvonne Kendall M.D.   On: 04/20/2022 09:00    Procedures Procedures (including critical care time)  Medications Ordered in UC Medications - No data to display  Initial Impression / Assessment and Plan / UC Course  I have reviewed the triage vital signs and the nursing  notes.  Pertinent labs & imaging results that were available during my care of the patient were reviewed by me and considered in my medical decision making (see chart for details).      Pt is a 57 y.o.  female with acute left ankle pain after fall this morning. . On exam, pt has tenderness at left lateral malleolus  and base of the fifth metatarsal.  Additionally, she has a hematoma overlying the 3rd and 4th metatarsals on the left.  Obtained  left ankle and hand plain films.  On chart review, she has history of distal  fibular fracture in the left ankle region. She was seen by podiatrist Dr Samara Deist in October 2018 for left ankle instability and referred to physical therapy.    Images personally reviewed by me were remarkable for fractures distal fibular.  No acute left hand fracture noted. Radiologist notes nondisplaced avulsion fracture of the distal with a fracture at the base of the fifth metatarsal.  There is mild diffuse soft tissue swelling.  She has a cam boot from previous injury in the exam room.  She is open to getting crutches today.  Crutches provided.  She took Advil prior to arrival.  Patient to follow-up with Dr. Samara Deist.  Pain control sent to the pharmacy as below.  Patient to gradually return to normal activities, as tolerated and continue ordinary activities within the limits permitted by pain.   Patient to follow up with orthopedic provider, as discussed.  Return and ED precautions given. Understanding voiced. Discussed MDM, treatment plan and plan for follow-up with patient who agrees with plan.   Final Clinical Impressions(s) / UC Diagnoses   Final diagnoses:  Closed nondisplaced fracture of fifth metatarsal bone of left foot, initial encounter  Closed fracture of distal end of left fibula, unspecified fracture morphology, initial encounter  Fall, initial encounter  Left hand pain     Discharge Instructions  Follow up with Dr. Rosana Hoes regarding your  fractures.  Use your crutches and walking boot.  Take Tramadol and Motrin as needed for pain. Stop by the pharmacy to pick up your prescriptions.      ED Prescriptions     Medication Sig Dispense Auth. Provider   traMADol (ULTRAM) 50 MG tablet Take 1-2 tablets (50-100 mg total) by mouth every 6 (six) hours as needed for up to 3 days. 12 tablet Deunta Beneke, DO      I have reviewed the PDMP during this encounter.   Lyndee Hensen, DO 04/20/22 1009    Lyndee Hensen, DO 04/20/22 1512

## 2022-05-23 ENCOUNTER — Encounter: Payer: Self-pay | Admitting: Emergency Medicine

## 2022-05-23 ENCOUNTER — Ambulatory Visit
Admission: EM | Admit: 2022-05-23 | Discharge: 2022-05-23 | Disposition: A | Discharge status: Home / Self Care | Payer: BC Managed Care – PPO | Attending: Emergency Medicine | Admitting: Emergency Medicine

## 2022-05-23 DIAGNOSIS — Z1152 Encounter for screening for COVID-19: Secondary | ICD-10-CM | POA: Insufficient documentation

## 2022-05-23 DIAGNOSIS — J069 Acute upper respiratory infection, unspecified: Secondary | ICD-10-CM | POA: Insufficient documentation

## 2022-05-23 LAB — RESP PANEL BY RT-PCR (RSV, FLU A&B, COVID)  RVPGX2
Influenza A by PCR: NEGATIVE
Influenza B by PCR: NEGATIVE
Resp Syncytial Virus by PCR: NEGATIVE
SARS Coronavirus 2 by RT PCR: NEGATIVE

## 2022-05-23 LAB — GROUP A STREP BY PCR: Group A Strep by PCR: NOT DETECTED

## 2022-05-23 MED ORDER — PROMETHAZINE-DM 6.25-15 MG/5ML PO SYRP: 5.0000 mL | ORAL_SOLUTION | Freq: Four times a day (QID) | ORAL | 0 refills | Status: DC | PRN

## 2022-05-23 MED ORDER — BENZONATATE 100 MG PO CAPS: 200.0000 mg | ORAL_CAPSULE | Freq: Three times a day (TID) | ORAL | 0 refills | Status: DC

## 2022-05-23 MED ORDER — IPRATROPIUM BROMIDE 0.06 % NA SOLN: 2.0000 | Freq: Four times a day (QID) | NASAL | 12 refills | Status: DC

## 2022-05-23 NOTE — ED Provider Notes (Signed)
[facility]    CSN: [id number] Arrival date & time: 05/23/22  0801      History   Chief Complaint Chief Complaint  Patient presents with   Headache   Sore Throat    HPI Meagan Becker is a 57 y.o. female.   HPI  57 year old female here for evaluation of respiratory symptoms.  The patient has a past medical history that is significant for hypertension, hyperlipidemia, anxiety disorder and a past surgical history significant for tonsillectomy presenting for evaluation of 2 days worth of headache, nasal congestion with light yellow nasal discharge, sinus pressure, ear pain, sore throat, and a nonproductive cough.  She denies fever, shortness of breath, wheezing, or GI complaints.  No known sick contacts other than her granddaughter who has similar symptoms that started after her.  Past Medical History:  Diagnosis Date   Anxiety disorder    Anxiety state    Cellulitis    Headache    History of 2019 novel coronavirus disease (COVID-19) 03/12/2019   Hyperlipidemia    Hypertension    Menopausal symptom    Obesity    Pain, joint, ankle    Panic disorder    Retinal vascular changes    Sprain of neck    Stricture of ureter    Upper respiratory infection    Wheezing     Patient Active Problem List   Diagnosis Date Noted   Essential hypertension 02/20/2013   Chest discomfort 02/20/2013    Past Surgical History:  Procedure Laterality Date   ABDOMINAL HYSTERECTOMY     ANKLE SURGERY     BREAST ENHANCEMENT SURGERY     CHOLECYSTECTOMY     TONSILLECTOMY      OB History   No obstetric history on file.      Home Medications    Prior to Admission medications   Medication Sig Start Date End Date Taking? Authorizing Provider  benzonatate (TESSALON) 100 MG capsule Take 2 capsules (200 mg total) by mouth every 8 (eight) hours. 05/23/22  Yes Margarette Canada, NP  buPROPion (WELLBUTRIN XL) 150 MG 24 hr tablet Take by mouth. 05/12/22  Yes [provider]   hydrochlorothiazide (HYDRODIURIL) 25 MG tablet Take 25 mg by mouth 2 (two) times daily. 02/28/21  Yes [provider]  ipratropium (ATROVENT) 0.06 % nasal spray Place 2 sprays into both nostrils 4 (four) times daily. 05/23/22  Yes Margarette Canada, NP  metoprolol succinate (TOPROL-XL) 25 MG 24 hr tablet Take 25 mg by mouth daily. 10/02/20  Yes [provider]  promethazine-dextromethorphan (PROMETHAZINE-DM) 6.25-15 MG/5ML syrup Take 5 mLs by mouth 4 (four) times daily as needed. 05/23/22  Yes Margarette Canada, NP  valsartan (DIOVAN) 160 MG tablet Take 160 mg by mouth daily. 01/02/21  Yes [provider]  albuterol (VENTOLIN HFA) 108 (90 Base) MCG/ACT inhaler Inhale 2 puffs into the lungs every 4 (four) hours as needed. 03/10/21   Margarette Canada, NP  diazepam (VALIUM) 10 MG tablet Take 0.5 tablets by mouth daily as needed. 03/06/16   [provider]  pantoprazole (PROTONIX) 20 MG tablet Take by mouth.    [provider]  Spacer/Aero-Holding Chambers (AEROCHAMBER MV) inhaler Use as instructed 03/10/21   Margarette Canada, NP    Family History Family History  Problem Relation Age of Onset   Hypercholesterolemia Mother    Hypertension Mother    Heart disease Father    Hypertension Father    Cancer Father    Cancer Other  Social History Social History   Tobacco Use   Smoking status: Never   Smokeless tobacco: Never  Vaping Use   Vaping Use: Never used  Substance Use Topics   Alcohol use: Not Currently   Drug use: Not Currently    Types: Marijuana    Comment: past - last use 3-4 years ago     Allergies   Hydrocodone-acetaminophen   Review of Systems Review of Systems  Constitutional:  Negative for fever.  HENT:  Positive for congestion, ear pain, rhinorrhea and sore throat.   Respiratory:  Positive for cough. Negative for shortness of breath and wheezing.   Gastrointestinal:  Negative for diarrhea, nausea and vomiting.  Skin:  Negative for rash.   Neurological:  Positive for headaches.  Hematological: Negative.   Psychiatric/Behavioral: Negative.       Physical Exam Triage Vital Signs ED Triage Vitals  Enc Vitals Group     BP 05/23/22 0825 133/84     Pulse Rate 05/23/22 0825 74     Resp 05/23/22 0825 15     Temp 05/23/22 0825 98.6 F (37 C)     Temp Source 05/23/22 0825 Oral     SpO2 05/23/22 0825 95 %     Weight 05/23/22 0822 213 lb 12.8 oz (97 kg)     Height 05/23/22 0822 '5\' 3"'$  (1.6 m)     Head Circumference --      Peak Flow --      Pain Score 05/23/22 0822 7     Pain Loc --      Pain Edu? --      Excl. in [place]? --    No data found.  Updated Vital Signs BP 133/84 (BP Location: Left Arm)   Pulse 74   Temp 98.6 F (37 C) (Oral)   Resp 15   Ht '5\' 3"'$  (1.6 m)   Wt 213 lb 12.8 oz (97 kg)   SpO2 95%   BMI 37.87 kg/m   Visual Acuity Right Eye Distance:   Left Eye Distance:   Bilateral Distance:    Right Eye Near:   Left Eye Near:    Bilateral Near:     Physical Exam Vitals and nursing note reviewed.  Constitutional:      Appearance: Normal appearance. She is not ill-appearing.  HENT:     Head: Normocephalic and atraumatic.     Right Ear: Tympanic membrane, ear canal and external ear normal. There is no impacted cerumen.     Left Ear: Tympanic membrane, ear canal and external ear normal. There is no impacted cerumen.     Nose: Congestion and rhinorrhea present.     Comments: Nasal mucosa is erythematous and edematous with clear discharge in both nares.    Mouth/Throat:     Mouth: Mucous membranes are moist.     Pharynx: Oropharynx is clear. Posterior oropharyngeal erythema present. No oropharyngeal exudate.     Comments: Tonsillar pillars are surgically absent but the posterior oropharynx is erythematous and injected with clear postnasal drip. Cardiovascular:     Rate and Rhythm: Normal rate and regular rhythm.     Pulses: Normal pulses.     Heart sounds: Normal heart sounds. No murmur heard.    No  friction rub. No gallop.  Pulmonary:     Effort: Pulmonary effort is normal.     Breath sounds: Normal breath sounds. No wheezing, rhonchi or rales.  Musculoskeletal:     Cervical back: Normal range of motion and neck  supple.  Lymphadenopathy:     Cervical: No cervical adenopathy.  Skin:    General: Skin is warm and dry.  Neurological:     Mental Status: She is alert.      UC Treatments / Results  Labs (all labs ordered are listed, but only abnormal results are displayed) Labs Reviewed  GROUP A STREP BY PCR  RESP PANEL BY RT-PCR (RSV, FLU A&B, COVID)  [id number]    EKG   Radiology No results found.  Procedures Procedures (including critical care time)  Medications Ordered in UC Medications - No data to display  Initial Impression / Assessment and Plan / UC Course  I have reviewed the triage vital signs and the nursing notes.  Pertinent labs & imaging results that were available during my care of the patient were reviewed by me and considered in my medical decision making (see chart for details).   Patient is a pleasant, nontoxic-appearing 57 year old female presenting for evaluation of flu/COVID-like symptoms that began 2 days ago and are not associated with a fever.  She is afebrile in clinic with a temperature of 98.6 orally.  Respiratory rate is 15 and oxygen saturation is 95%.  She does have inflammation of her upper respiratory tract on exam but her nasal discharge and postnasal drip are both clear.  Cardiopulmonary exam reveals clear lung sounds in all fields.  Given her symptomology I will order a respiratory panel to look for the presence of COVID or influenza as well as a strep PCR look for the presence of strep given her sore throat.  Respiratory panel is negative for COVID, flu, and RSV.  Strep PCR is negative.  I will discharge patient home with a diagnosis of viral URI with a cough and treated with Atrovent nasal spray, Tessalon Perles, and Promethazine DM  cough syrup.  Over-the-counter Tylenol and ibuprofen as needed for pain.  Return precautions reviewed.   Final Clinical Impressions(s) / UC Diagnoses   Final diagnoses:  Viral URI with cough     Discharge Instructions      Today were negative for RSV, influenza, COVID, and strep.  I do believe you have a viral respiratory infection.  Use over-the-counter Tylenol and/or ibuprofen according to the package instructions as needed for pain and bodyaches.  You may gargle with warm salt water to help soothe your throat and wash away the drainage which could be causing irritation.  You can also use over-the-counter Chloraseptic or Sucrets lozenges according to the package instructions.  I recommend no more than 1 lozenge every 2 hours as the menthol may give you diarrhea.  Use the Atrovent nasal spray, 2 squirts in each nostril every 6 hours, as needed for runny nose and postnasal drip.  Use the Tessalon Perles every 8 hours during the day.  Take them with a small sip of water.  They may give you some numbness to the base of your tongue or a metallic taste in your mouth, this is normal.  Use the Promethazine DM cough syrup at bedtime for cough and congestion.  It will make you drowsy so do not take it during the day.  Return for reevaluation or see your primary care provider for any new or worsening symptoms.      ED Prescriptions     Medication Sig Dispense Auth. Provider   benzonatate (TESSALON) 100 MG capsule Take 2 capsules (200 mg total) by mouth every 8 (eight) hours. 21 capsule Margarette Canada, NP   ipratropium (ATROVENT) 0.06 %  nasal spray Place 2 sprays into both nostrils 4 (four) times daily. 15 mL Margarette Canada, NP   promethazine-dextromethorphan (PROMETHAZINE-DM) 6.25-15 MG/5ML syrup Take 5 mLs by mouth 4 (four) times daily as needed. 118 mL Margarette Canada, NP      PDMP not reviewed this encounter.   Margarette Canada, NP 05/23/22 [phone]

## 2022-05-23 NOTE — ED Triage Notes (Signed)
Patient c/o headache, cough, sinus congestion, and sore throat that started 2 days ago.  Patient denies fevers.

## 2022-05-23 NOTE — Discharge Instructions (Addendum)
Today were negative for RSV, influenza, COVID, and strep.  I do believe you have a viral respiratory infection.  Use over-the-counter Tylenol and/or ibuprofen according to the package instructions as needed for pain and bodyaches.  You may gargle with warm salt water to help soothe your throat and wash away the drainage which could be causing irritation.  You can also use over-the-counter Chloraseptic or Sucrets lozenges according to the package instructions.  I recommend no more than 1 lozenge every 2 hours as the menthol may give you diarrhea.  Use the Atrovent nasal spray, 2 squirts in each nostril every 6 hours, as needed for runny nose and postnasal drip.  Use the Tessalon Perles every 8 hours during the day.  Take them with a small sip of water.  They may give you some numbness to the base of your tongue or a metallic taste in your mouth, this is normal.  Use the Promethazine DM cough syrup at bedtime for cough and congestion.  It will make you drowsy so do not take it during the day.  Return for reevaluation or see your primary care provider for any new or worsening symptoms.

## 2022-06-25 ENCOUNTER — Emergency Department: Payer: BC Managed Care – PPO

## 2022-06-25 ENCOUNTER — Emergency Department
Admission: EM | Admit: 2022-06-25 | Discharge: 2022-06-25 | Disposition: A | Discharge status: Home / Self Care | Payer: BC Managed Care – PPO | Attending: Emergency Medicine | Admitting: Emergency Medicine

## 2022-06-25 ENCOUNTER — Other Ambulatory Visit: Payer: Self-pay

## 2022-06-25 DIAGNOSIS — K209 Esophagitis, unspecified without bleeding: Secondary | ICD-10-CM | POA: Insufficient documentation

## 2022-06-25 DIAGNOSIS — E876 Hypokalemia: Secondary | ICD-10-CM | POA: Insufficient documentation

## 2022-06-25 DIAGNOSIS — R1013 Epigastric pain: Secondary | ICD-10-CM | POA: Diagnosis present

## 2022-06-25 DIAGNOSIS — I10 Essential (primary) hypertension: Secondary | ICD-10-CM | POA: Diagnosis not present

## 2022-06-25 LAB — TROPONIN I (HIGH SENSITIVITY): Troponin I (High Sensitivity): 2 ng/L (ref <18)

## 2022-06-25 LAB — CBC
HCT: 38.8 % (ref 36.0–46.0)
Hemoglobin: 12.9 g/dL (ref 12.0–15.0)
MCH: 29.6 pg (ref 26.0–34.0)
MCHC: 33.2 g/dL (ref 30.0–36.0)
MCV: 89 fL (ref 80.0–100.0)
Platelets: 405 10*3/uL — ABNORMAL HIGH (ref 150–400)
RBC: 4.36 MIL/uL (ref 3.87–5.11)
RDW: 12.2 % (ref 11.5–15.5)
WBC: 6.7 10*3/uL (ref 4.0–10.5)
nRBC: 0 % (ref 0.0–0.2)

## 2022-06-25 LAB — HEPATIC FUNCTION PANEL
ALT: 37 U/L (ref 0–44)
AST: 37 U/L (ref 15–41)
Albumin: 4.2 g/dL (ref 3.5–5.0)
Alkaline Phosphatase: 44 U/L (ref 38–126)
Bilirubin, Direct: <0.1 mg/dL (ref 0.0–0.2)
Total Bilirubin: 1.2 mg/dL (ref 0.3–1.2)
Total Protein: 7.9 g/dL (ref 6.5–8.1)

## 2022-06-25 LAB — BASIC METABOLIC PANEL
Anion gap: 11 (ref 5–15)
BUN: 12 mg/dL (ref 6–20)
CO2: 28 mmol/L (ref 22–32)
Calcium: 10 mg/dL (ref 8.9–10.3)
Chloride: 99 mmol/L (ref 98–111)
Creatinine, Ser: 0.98 mg/dL (ref 0.44–1.00)
GFR, Estimated: >60 mL/min (ref >60)
Glucose, Bld: 100 mg/dL — ABNORMAL HIGH (ref 70–99)
Potassium: 3 mmol/L — ABNORMAL LOW (ref 3.5–5.1)
Sodium: 138 mmol/L (ref 135–145)

## 2022-06-25 LAB — LIPASE, BLOOD: Lipase: 28 U/L (ref 11–51)

## 2022-06-25 MED ORDER — KETOROLAC TROMETHAMINE 30 MG/ML IJ SOLN
30.0000 mg | Freq: Once | INTRAMUSCULAR | Status: AC
  Administered 2022-06-25: 30 mg via INTRAVENOUS
  Filled 2022-06-25: qty 1

## 2022-06-25 MED ORDER — SUCRALFATE 1 GM/10ML PO SUSP: 1.0000 g | Freq: Four times a day (QID) | ORAL | 1 refills | Status: AC

## 2022-06-25 MED ORDER — ONDANSETRON HCL 4 MG/2ML IJ SOLN
4.0000 mg | Freq: Once | INTRAMUSCULAR | Status: AC
  Administered 2022-06-25: 4 mg via INTRAVENOUS
  Filled 2022-06-25: qty 2

## 2022-06-25 MED ORDER — IOHEXOL 300 MG/ML  SOLN
100.0000 mL | Freq: Once | INTRAMUSCULAR | Status: AC | PRN
  Administered 2022-06-25: 100 mL via INTRAVENOUS

## 2022-06-25 MED ORDER — ALUM & MAG HYDROXIDE-SIMETH 200-200-20 MG/5ML PO SUSP
30.0000 mL | Freq: Once | ORAL | Status: AC
  Administered 2022-06-25: 30 mL via ORAL
  Filled 2022-06-25: qty 30

## 2022-06-25 MED ORDER — FAMOTIDINE IN NACL 20-0.9 MG/50ML-% IV SOLN
20.0000 mg | Freq: Once | INTRAVENOUS | Status: AC
  Administered 2022-06-25: 20 mg via INTRAVENOUS
  Filled 2022-06-25: qty 50

## 2022-06-25 MED ORDER — POTASSIUM CHLORIDE CRYS ER 20 MEQ PO TBCR
40.0000 meq | EXTENDED_RELEASE_TABLET | Freq: Once | ORAL | Status: AC
  Administered 2022-06-25: 40 meq via ORAL
  Filled 2022-06-25: qty 2

## 2022-06-25 NOTE — ED Triage Notes (Signed)
Pt to ED for epigastric pain for a couple weeks, reports pain worsening. +nausea.

## 2022-06-25 NOTE — ED Provider Notes (Signed)
[facility] Provider Note    Event Date/Time   First MD Initiated Contact with Patient 06/25/22 1339     (approximate)   History   Abdominal Pain and Chest Pain   HPI  Meagan Becker is a 57 y.o. female with history of hypertension, hyperlipidemia, presents emergency department with epigastric pain for 2 to 3 weeks.  States worsened over the last couple of days since becoming very concerned as she hears gurgling up into her chest.  States she is very nauseated but has not had any vomiting.  Patient does have a history of a cholecystectomy.  States she does have a history of fatty liver.  Patient states she is also not having normal bowel movements.  She is only getting very hard small pieces out.  Denies fever or chills      Physical Exam   Triage Vital Signs: ED Triage Vitals  Enc Vitals Group     BP 06/25/22 1307 (!) 122/91     Pulse Rate 06/25/22 1305 77     Resp 06/25/22 1305 18     Temp 06/25/22 1305 98.5 F (36.9 C)     Temp src --      SpO2 06/25/22 1305 100 %     Weight 06/25/22 1306 207 lb (93.9 kg)     Height 06/25/22 1306 5\' 3"  (1.6 m)     Head Circumference --      Peak Flow --      Pain Score 06/25/22 1306 7     Pain Loc --      Pain Edu? --      Excl. in [place]? --     Most recent vital signs: Vitals:   06/25/22 1305 06/25/22 1307  BP:  (!) 122/91  Pulse: 77   Resp: 18   Temp: 98.5 F (36.9 C)   SpO2: 100%      General: Awake, no distress.   CV:  Good peripheral perfusion. regular rate and  rhythm Resp:  Normal effort. Lungs cta Abd:  No distention.  Tender in the epigastric and right upper quadrant, bowel sounds present in all 4 quadrants Other:      ED Results / Procedures / Treatments   Labs (all labs ordered are listed, but only abnormal results are displayed) Labs Reviewed  BASIC METABOLIC PANEL - Abnormal; Notable for the following components:      Result Value   Potassium 3.0 (*)    Glucose, Bld 100 (*)     All other components within normal limits  CBC - Abnormal; Notable for the following components:   Platelets 405 (*)    All other components within normal limits  HEPATIC FUNCTION PANEL  LIPASE, BLOOD  TROPONIN I (HIGH SENSITIVITY)     EKG  EKG   RADIOLOGY Chest x-ray, CT abdomen pelvis    PROCEDURES:   Procedures   MEDICATIONS ORDERED IN ED: Medications  ondansetron (ZOFRAN) injection 4 mg (4 mg Intravenous Given 06/25/22 1400)  famotidine (PEPCID) IVPB 20 mg premix (0 mg Intravenous Stopped 06/25/22 1435)  iohexol (OMNIPAQUE) 300 MG/ML solution 100 mL (100 mLs Intravenous Contrast Given 06/25/22 1410)  potassium chloride SA (KLOR-CON M) CR tablet 40 mEq (40 mEq Oral Given 06/25/22 1456)  ketorolac (TORADOL) 30 MG/ML injection 30 mg (30 mg Intravenous Given 06/25/22 1450)  alum & mag hydroxide-simeth (MAALOX/MYLANTA) 200-200-20 MG/5ML suspension 30 mL (30 mLs Oral Given 06/25/22 1451)     IMPRESSION / MDM / ASSESSMENT  AND PLAN / ED COURSE  I reviewed the triage vital signs and the nursing notes.                              Differential diagnosis includes, but is not limited to, MI, pancreatitis, PUD, gastritis, bowel obstruction, esophagitis  Patient's presentation is most consistent with acute presentation with potential threat to life or bodily function.   Patient's initial labs are reassuring, CBC troponin and basic metabolic panel appear to be normal except for potassium decreased at 3.0, did add lipase and hepatic function due to the epigastric pain  EKG shows normal sinus rhythm, incomplete parison to a old EKG there is no change on her EKG.  I interpret this as normal   I did personally review the radiologist report and images.  I interpret this as being negative for any acute abnormality.  Remainder of her labs are reassuring  Patient was given potassium 40 mEq here in the ED due to low potassium.  She is also given Zofran and Pepcid IV.  Patient did not  have a lot of relief with those medications so gave her Toradol and a GI cocktail which gave her a lot of relief.    I do not feel that the patient would benefit from admission at this time.  Labs are normal she is very stable.  Feel this can be treated as outpatient.  However she was given strict instructions to return if worsening.  Did reassure her that I do not think this has any cardiac entity.  The patient was given a prescription for Carafate.  Also explained to her the GI cocktail was just a mixture of Maalox and Mylanta which is over-the-counter.  Follow-up with her regular doctor.  She is in agreement treatment plan.  Discharged in stable condition.   FINAL CLINICAL IMPRESSION(S) / ED DIAGNOSES   Final diagnoses:  Esophagitis  Hypokalemia     Rx / DC Orders   ED Discharge Orders          Ordered    sucralfate (CARAFATE) 1 GM/10ML suspension  4 times daily        06/25/22 1522             Note:  This document was prepared using Dragon voice recognition software and may include unintentional dictation errors.    Faythe GheeFisher, Nezzie Manera W, PA-C 06/25/22 1536    Dionne BucySiadecki, Sebastian, MD 06/25/22 [phone]

## 2022-06-25 NOTE — Discharge Instructions (Signed)
Follow-up with your regular doctor.  Use the Carafate as prescribed.  The medicine we gave you here today was a mix of Maalox and Mylanta. Call your doctor and ask her if she would like for you to be on potassium medication.  You may just want to have your labs rechecked in a week to make sure your potassium levels are back to normal.

## 2022-09-24 ENCOUNTER — Other Ambulatory Visit: Payer: Self-pay | Admitting: Family Medicine

## 2022-09-24 DIAGNOSIS — N63 Unspecified lump in unspecified breast: Secondary | ICD-10-CM

## 2022-09-25 ENCOUNTER — Ambulatory Visit
Admission: RE | Admit: 2022-09-25 | Discharge: 2022-09-25 | Disposition: A | Discharge status: Home / Self Care | Payer: Self-pay | Source: Ambulatory Visit | Attending: Family Medicine | Admitting: Family Medicine

## 2022-09-25 ENCOUNTER — Ambulatory Visit
Admission: RE | Admit: 2022-09-25 | Discharge: 2022-09-25 | Disposition: A | Discharge status: Home / Self Care | Payer: BC Managed Care – PPO | Source: Ambulatory Visit | Attending: Family Medicine | Admitting: Family Medicine

## 2022-09-25 ENCOUNTER — Other Ambulatory Visit: Payer: Self-pay | Admitting: *Deleted

## 2022-09-25 ENCOUNTER — Encounter: Payer: Self-pay | Admitting: *Deleted

## 2022-09-25 DIAGNOSIS — Z1231 Encounter for screening mammogram for malignant neoplasm of breast: Secondary | ICD-10-CM

## 2022-09-25 DIAGNOSIS — N63 Unspecified lump in unspecified breast: Secondary | ICD-10-CM | POA: Insufficient documentation

## 2022-10-15 ENCOUNTER — Other Ambulatory Visit: Payer: PRIVATE HEALTH INSURANCE

## 2022-12-04 ENCOUNTER — Other Ambulatory Visit: Payer: Self-pay

## 2022-12-11 ENCOUNTER — Encounter: Payer: Self-pay | Admitting: *Deleted

## 2022-12-11 ENCOUNTER — Ambulatory Visit
Admission: RE | Admit: 2022-12-11 | Discharge: 2022-12-11 | Disposition: A | Discharge status: Home / Self Care | Payer: BC Managed Care – PPO | Attending: Gastroenterology | Admitting: Gastroenterology

## 2022-12-11 ENCOUNTER — Ambulatory Visit: Payer: BC Managed Care – PPO | Admitting: Certified Registered"

## 2022-12-11 ENCOUNTER — Encounter
Admission: RE | Disposition: A | Discharge status: Home / Self Care | Payer: Self-pay | Source: Home / Self Care | Attending: Gastroenterology

## 2022-12-11 DIAGNOSIS — E669 Obesity, unspecified: Secondary | ICD-10-CM | POA: Diagnosis not present

## 2022-12-11 DIAGNOSIS — F419 Anxiety disorder, unspecified: Secondary | ICD-10-CM | POA: Insufficient documentation

## 2022-12-11 DIAGNOSIS — R1013 Epigastric pain: Secondary | ICD-10-CM | POA: Diagnosis present

## 2022-12-11 DIAGNOSIS — K449 Diaphragmatic hernia without obstruction or gangrene: Secondary | ICD-10-CM | POA: Insufficient documentation

## 2022-12-11 DIAGNOSIS — I10 Essential (primary) hypertension: Secondary | ICD-10-CM | POA: Diagnosis not present

## 2022-12-11 DIAGNOSIS — Z79899 Other long term (current) drug therapy: Secondary | ICD-10-CM | POA: Insufficient documentation

## 2022-12-11 HISTORY — PX: ESOPHAGOGASTRODUODENOSCOPY (EGD) WITH PROPOFOL: SHX5813

## 2022-12-11 SURGERY — ESOPHAGOGASTRODUODENOSCOPY (EGD) WITH PROPOFOL
Anesthesia: General

## 2022-12-11 MED ORDER — SODIUM CHLORIDE 0.9 % IV SOLN: INTRAVENOUS | Status: DC

## 2022-12-11 MED ORDER — LIDOCAINE HCL (PF) 2 % IJ SOLN
INTRAMUSCULAR | Status: AC
  Filled 2022-12-11: qty 40

## 2022-12-11 MED ORDER — PROPOFOL 10 MG/ML IV BOLUS
INTRAVENOUS | Status: DC | PRN
  Administered 2022-12-11: 100 mg via INTRAVENOUS

## 2022-12-11 MED ORDER — MIDAZOLAM HCL 2 MG/2ML IJ SOLN
INTRAMUSCULAR | Status: AC
  Filled 2022-12-11: qty 2

## 2022-12-11 MED ORDER — MIDAZOLAM HCL 2 MG/2ML IJ SOLN
INTRAMUSCULAR | Status: DC | PRN
  Administered 2022-12-11: 2 mg via INTRAVENOUS

## 2022-12-11 MED ORDER — PROPOFOL 500 MG/50ML IV EMUL
INTRAVENOUS | Status: DC | PRN
  Administered 2022-12-11: 175 ug/kg/min via INTRAVENOUS

## 2022-12-11 NOTE — Interval H&P Note (Signed)
History and Physical Interval Note:  12/11/2022 12:00 PM  Meagan Becker  has presented today for surgery, with the diagnosis of Epigasric pain.  The various methods of treatment have been discussed with the patient and family. After consideration of risks, benefits and other options for treatment, the patient has consented to  Procedure(s): ESOPHAGOGASTRODUODENOSCOPY (EGD) WITH PROPOFOL (N/A) as a surgical intervention.  The patient's history has been reviewed, patient examined, no change in status, stable for surgery.  I have reviewed the patient's chart and labs.  Questions were answered to the patient's satisfaction.     Regis Bill  Ok to proceed with EGD

## 2022-12-11 NOTE — Anesthesia Preprocedure Evaluation (Signed)
Anesthesia Evaluation  Patient identified by MRN, date of birth, ID band Patient awake    Reviewed: Allergy & Precautions, NPO status , Patient's Chart, lab work & pertinent test results  History of Anesthesia Complications Negative for: history of anesthetic complications  Airway Mallampati: III  TM Distance: >3 FB Neck ROM: full    Dental  (+) Chipped, Dental Advidsory Given   Pulmonary neg pulmonary ROS   Pulmonary exam normal        Cardiovascular hypertension, negative cardio ROS Normal cardiovascular exam     Neuro/Psych  PSYCHIATRIC DISORDERS Anxiety     negative neurological ROS     GI/Hepatic negative GI ROS, Neg liver ROS,,,  Endo/Other  negative endocrine ROS    Renal/GU negative Renal ROS  negative genitourinary   Musculoskeletal   Abdominal   Peds  Hematology negative hematology ROS (+)   Anesthesia Other Findings Past Medical History: No date: Anxiety disorder No date: Anxiety state No date: Cellulitis No date: Headache 03/12/2019: History of 2019 novel coronavirus disease (COVID-19) No date: Hyperlipidemia No date: Hypertension No date: Menopausal symptom No date: Obesity No date: Pain, joint, ankle No date: Panic disorder No date: Retinal vascular changes No date: Sprain of neck No date: Stricture of ureter No date: Upper respiratory infection No date: Wheezing  Past Surgical History: No date: ABDOMINAL HYSTERECTOMY No date: ANKLE SURGERY No date: BREAST ENHANCEMENT SURGERY No date: CHOLECYSTECTOMY No date: REDUCTION MAMMAPLASTY; Bilateral     Comment:  Pt had breast reduction surgery 30 years or so ago No date: TONSILLECTOMY     Reproductive/Obstetrics negative OB ROS                             Anesthesia Physical Anesthesia Plan  ASA: 3  Anesthesia Plan: General   Post-op Pain Management: Minimal or no pain anticipated   Induction:  Intravenous  PONV Risk Score and Plan: 3 and Propofol infusion, TIVA and Ondansetron  Airway Management Planned: Nasal Cannula  Additional Equipment: None  Intra-op Plan:   Post-operative Plan:   Informed Consent: I have reviewed the patients History and Physical, chart, labs and discussed the procedure including the risks, benefits and alternatives for the proposed anesthesia with the patient or authorized representative who has indicated his/her understanding and acceptance.     Dental advisory given  Plan Discussed with: CRNA and Surgeon  Anesthesia Plan Comments: (Discussed risks of anesthesia with patient, including possibility of difficulty with spontaneous ventilation under anesthesia necessitating airway intervention, PONV, and rare risks such as cardiac or respiratory or neurological events, and allergic reactions. Discussed the role of CRNA in patient's perioperative care. Patient understands.)       Anesthesia Quick Evaluation

## 2022-12-11 NOTE — Transfer of Care (Signed)
Immediate Anesthesia Transfer of Care Note  Patient: Meagan Becker  Procedure(s) Performed: ESOPHAGOGASTRODUODENOSCOPY (EGD) WITH PROPOFOL  Patient Location: PACU and Endoscopy Unit  Anesthesia Type:General  Level of Consciousness: drowsy  Airway & Oxygen Therapy: Patient Spontanous Breathing  Post-op Assessment: Report given to RN and Post -op Vital signs reviewed and stable  Post vital signs: Reviewed and stable  Last Vitals:  Vitals Value Taken Time  BP 128/76   Temp    Pulse 81   Resp 19   SpO2 100     Last Pain: There were no vitals filed for this visit.       Complications: No notable events documented.

## 2022-12-11 NOTE — Anesthesia Postprocedure Evaluation (Signed)
Anesthesia Post Note  Patient: Meagan Becker  Procedure(s) Performed: ESOPHAGOGASTRODUODENOSCOPY (EGD) WITH PROPOFOL  Patient location during evaluation: Endoscopy Anesthesia Type: General Level of consciousness: awake and alert Pain management: pain level controlled Vital Signs Assessment: post-procedure vital signs reviewed and stable Respiratory status: spontaneous breathing, nonlabored ventilation, respiratory function stable and patient connected to nasal cannula oxygen Cardiovascular status: blood pressure returned to baseline and stable Postop Assessment: no apparent nausea or vomiting Anesthetic complications: no  No notable events documented.   Last Vitals:  Vitals:   12/11/22 1229 12/11/22 1237  BP: 108/71 113/80  Pulse: 77 76  Resp: 16 13  Temp:    SpO2: 98% 100%    Last Pain:  Vitals:   12/11/22 1229  TempSrc:   PainSc: 0-No pain                 Stephanie Coup

## 2022-12-11 NOTE — Op Note (Signed)
[facility] Gastroenterology Patient Name: Meagan Becker Procedure Date: 12/11/2022 11:49 AM MRN: [id number] Account #: [id number] Date of Birth: 10/22/1965 Admit Type: Outpatient Age: 57 Room: [facility] ENDO ROOM 3 Gender: Female Note Status: Finalized Instrument Name: Upper Endoscope [id number] Procedure:             Upper GI endoscopy Indications:           Epigastric abdominal pain Providers:             Eather Colas MD, MD Referring MD:          Dortha Kern (Referring MD) Medicines:             Monitored Anesthesia Care Complications:         No immediate complications. Estimated blood loss:                         Minimal. Procedure:             Pre-Anesthesia Assessment:                        - Prior to the procedure, a History and Physical was                         performed, and patient medications and allergies were                         reviewed. The patient is competent. The risks and                         benefits of the procedure and the sedation options and                         risks were discussed with the patient. All questions                         were answered and informed consent was obtained.                         Patient identification and proposed procedure were                         verified by the physician, the nurse, the                         anesthesiologist, the anesthetist and the technician                         in the endoscopy suite. Mental Status Examination:                         alert and oriented. Airway Examination: normal                         oropharyngeal airway and neck mobility. Respiratory                         Examination: clear to auscultation. CV Examination:  normal. Prophylactic Antibiotics: The patient does not                         require prophylactic antibiotics. Prior                         Anticoagulants: The patient has taken no anticoagulant                          or antiplatelet agents. ASA Grade Assessment: III - A                         patient with severe systemic disease. After reviewing                         the risks and benefits, the patient was deemed in                         satisfactory condition to undergo the procedure. The                         anesthesia plan was to use monitored anesthesia care                         (MAC). Immediately prior to administration of                         medications, the patient was re-assessed for adequacy                         to receive sedatives. The heart rate, respiratory                         rate, oxygen saturations, blood pressure, adequacy of                         pulmonary ventilation, and response to care were                         monitored throughout the procedure. The physical                         status of the patient was re-assessed after the                         procedure.                        After obtaining informed consent, the endoscope was                         passed under direct vision. Throughout the procedure,                         the patient's blood pressure, pulse, and oxygen                         saturations were monitored continuously. The Endoscope  was introduced through the mouth, and advanced to the                         second part of duodenum. The upper GI endoscopy was                         accomplished without difficulty. The patient tolerated                         the procedure well. Findings:      A small hiatal hernia was present.      The exam of the esophagus was otherwise normal.      The entire examined stomach was normal. Biopsies were taken with a cold       forceps for histology. Estimated blood loss was minimal.      The examined duodenum was normal. Impression:            - Small hiatal hernia.                        - Normal stomach. Biopsied.                        - Normal  examined duodenum. Recommendation:        - Discharge patient to home.                        - Resume previous diet.                        - Continue present medications.                        - Await pathology results.                        - Return to referring physician as previously                         scheduled. Procedure Code(s):     --- Professional ---                        [phone], Esophagogastroduodenoscopy, flexible,                         transoral; with biopsy, single or multiple Diagnosis Code(s):     --- Professional ---                        K44.9, Diaphragmatic hernia without obstruction or                         gangrene                        R10.13, Epigastric pain CPT copyright 2022 [employer]. All rights reserved. The codes documented in this report are preliminary and upon coder review may  be revised to meet current compliance requirements. Eather Colas MD, MD 12/11/2022 12:16:17 PM Number of Addenda: 0 Note Initiated On: 12/11/2022 11:49 AM Estimated Blood Loss:  Estimated blood loss was minimal.  [facility]

## 2022-12-11 NOTE — H&P (Signed)
Outpatient short stay form Pre-procedure 12/11/2022  Regis Bill, MD  Primary Physician: Dortha Kern, MD  Reason for visit:  Epigastric pain  History of present illness:    57 y/o lady with history of hypertension, anxiety, and obesity here for epigastric pain that improved after PPI and carafate. No blood thinners. No family history of GI malignancies. History of hysterectomy and cholecystectomy.    Current Facility-Administered Medications:    0.9 %  sodium chloride infusion, , Intravenous, Continuous, Swade Shonka, Rossie Muskrat, MD  Medications Prior to Admission  Medication Sig Dispense Refill Last Dose   buPROPion (WELLBUTRIN XL) 150 MG 24 hr tablet Take by mouth.   12/10/2022   diazepam (VALIUM) 10 MG tablet Take 0.5 tablets by mouth daily as needed.   12/11/2022 at 0900   hydrochlorothiazide (HYDRODIURIL) 25 MG tablet Take 25 mg by mouth 2 (two) times daily.   12/10/2022   metoprolol succinate (TOPROL-XL) 25 MG 24 hr tablet Take 25 mg by mouth daily.   12/10/2022   pantoprazole (PROTONIX) 20 MG tablet Take by mouth.   12/10/2022   Spacer/Aero-Holding Chambers (AEROCHAMBER MV) inhaler Use as instructed 1 each 2 12/10/2022   sucralfate (CARAFATE) 1 GM/10ML suspension Take 10 mLs (1 g total) by mouth 4 (four) times daily. 420 mL 1 12/10/2022   valsartan (DIOVAN) 160 MG tablet Take 160 mg by mouth daily.   12/11/2022 at 0600   albuterol (VENTOLIN HFA) 108 (90 Base) MCG/ACT inhaler Inhale 2 puffs into the lungs every 4 (four) hours as needed. 18 g 0    benzonatate (TESSALON) 100 MG capsule Take 2 capsules (200 mg total) by mouth every 8 (eight) hours. (Patient not taking: Reported on 12/11/2022) 21 capsule 0 Not Taking   ipratropium (ATROVENT) 0.06 % nasal spray Place 2 sprays into both nostrils 4 (four) times daily. (Patient not taking: Reported on 12/11/2022) 15 mL 12 Not Taking   promethazine-dextromethorphan (PROMETHAZINE-DM) 6.25-15 MG/5ML syrup Take 5 mLs by mouth 4 (four) times daily  as needed. 118 mL 0  at prn     Allergies  Allergen Reactions   Hydrocodone-Acetaminophen Anaphylaxis    "I had to think to breathe."      Past Medical History:  Diagnosis Date   Anxiety disorder    Anxiety state    Cellulitis    Headache    History of 2019 novel coronavirus disease (COVID-19) 03/12/2019   Hyperlipidemia    Hypertension    Menopausal symptom    Obesity    Pain, joint, ankle    Panic disorder    Retinal vascular changes    Sprain of neck    Stricture of ureter    Upper respiratory infection    Wheezing     Review of systems:  Otherwise negative.    Physical Exam  Gen: Alert, oriented. Appears stated age.  HEENT: PERRLA. Lungs: No respiratory distress CV: RRR Abd: soft, benign, no masses Ext: No edema    Planned procedures: Proceed with EGD. The patient understands the nature of the planned procedure, indications, risks, alternatives and potential complications including but not limited to bleeding, infection, perforation, damage to internal organs and possible oversedation/side effects from anesthesia. The patient agrees and gives consent to proceed.  Please refer to procedure notes for findings, recommendations and patient disposition/instructions.     Regis Bill, MD [facility] Gastroenterology

## 2022-12-14 ENCOUNTER — Encounter: Payer: Self-pay | Admitting: Gastroenterology

## 2022-12-14 LAB — SURGICAL PATHOLOGY

## 2023-04-16 ENCOUNTER — Emergency Department: Payer: BC Managed Care – PPO

## 2023-04-16 ENCOUNTER — Emergency Department
Admission: EM | Admit: 2023-04-16 | Discharge: 2023-04-16 | Disposition: A | Discharge status: Home / Self Care | Payer: BC Managed Care – PPO | Attending: Emergency Medicine | Admitting: Emergency Medicine

## 2023-04-16 ENCOUNTER — Other Ambulatory Visit: Payer: Self-pay

## 2023-04-16 DIAGNOSIS — R0602 Shortness of breath: Secondary | ICD-10-CM | POA: Insufficient documentation

## 2023-04-16 DIAGNOSIS — R42 Dizziness and giddiness: Secondary | ICD-10-CM | POA: Diagnosis not present

## 2023-04-16 DIAGNOSIS — I10 Essential (primary) hypertension: Secondary | ICD-10-CM | POA: Diagnosis not present

## 2023-04-16 DIAGNOSIS — R079 Chest pain, unspecified: Secondary | ICD-10-CM | POA: Diagnosis not present

## 2023-04-16 DIAGNOSIS — R519 Headache, unspecified: Secondary | ICD-10-CM | POA: Diagnosis not present

## 2023-04-16 LAB — CBC WITH DIFFERENTIAL/PLATELET
Abs Immature Granulocytes: 0.02 10*3/uL (ref 0.00–0.07)
Basophils Absolute: 0 10*3/uL (ref 0.0–0.1)
Basophils Relative: 1 %
Eosinophils Absolute: 0.2 10*3/uL (ref 0.0–0.5)
Eosinophils Relative: 3 %
HCT: 37.5 % (ref 36.0–46.0)
Hemoglobin: 12.8 g/dL (ref 12.0–15.0)
Immature Granulocytes: 0 %
Lymphocytes Relative: 38 %
Lymphs Abs: 3.1 10*3/uL (ref 0.7–4.0)
MCH: 29.6 pg (ref 26.0–34.0)
MCHC: 34.1 g/dL (ref 30.0–36.0)
MCV: 86.8 fL (ref 80.0–100.0)
Monocytes Absolute: 0.4 10*3/uL (ref 0.1–1.0)
Monocytes Relative: 5 %
Neutro Abs: 4.3 10*3/uL (ref 1.7–7.7)
Neutrophils Relative %: 53 %
Platelets: 354 10*3/uL (ref 150–400)
RBC: 4.32 MIL/uL (ref 3.87–5.11)
RDW: 12.4 % (ref 11.5–15.5)
WBC: 8.1 10*3/uL (ref 4.0–10.5)
nRBC: 0 % (ref 0.0–0.2)

## 2023-04-16 LAB — TROPONIN I (HIGH SENSITIVITY)
Troponin I (High Sensitivity): 3 ng/L (ref <18)
Troponin I (High Sensitivity): 3 ng/L (ref <18)

## 2023-04-16 LAB — COMPREHENSIVE METABOLIC PANEL
ALT: 22 U/L (ref 0–44)
AST: 26 U/L (ref 15–41)
Albumin: 4.3 g/dL (ref 3.5–5.0)
Alkaline Phosphatase: 45 U/L (ref 38–126)
Anion gap: 12 (ref 5–15)
BUN: 19 mg/dL (ref 6–20)
CO2: 27 mmol/L (ref 22–32)
Calcium: 9.7 mg/dL (ref 8.9–10.3)
Chloride: 100 mmol/L (ref 98–111)
Creatinine, Ser: 0.88 mg/dL (ref 0.44–1.00)
GFR, Estimated: >60 mL/min (ref >60)
Glucose, Bld: 119 mg/dL — ABNORMAL HIGH (ref 70–99)
Potassium: 3.3 mmol/L — ABNORMAL LOW (ref 3.5–5.1)
Sodium: 139 mmol/L (ref 135–145)
Total Bilirubin: 1.3 mg/dL — ABNORMAL HIGH (ref 0.0–1.2)
Total Protein: 7.9 g/dL (ref 6.5–8.1)

## 2023-04-16 LAB — D-DIMER, QUANTITATIVE: D-Dimer, Quant: 0.41 ug{FEU}/mL (ref 0.00–0.50)

## 2023-04-16 LAB — BRAIN NATRIURETIC PEPTIDE: B Natriuretic Peptide: 24.1 pg/mL (ref 0.0–100.0)

## 2023-04-16 MED ORDER — PROCHLORPERAZINE EDISYLATE 10 MG/2ML IJ SOLN
10.0000 mg | Freq: Once | INTRAMUSCULAR | Status: AC
  Administered 2023-04-16: 10 mg via INTRAVENOUS
  Filled 2023-04-16: qty 2

## 2023-04-16 MED ORDER — LACTATED RINGERS IV BOLUS
1000.0000 mL | Freq: Once | INTRAVENOUS | Status: AC
  Administered 2023-04-16: 1000 mL via INTRAVENOUS

## 2023-04-16 MED ORDER — MECLIZINE HCL 25 MG PO TABS: 25.0000 mg | ORAL_TABLET | Freq: Once | ORAL | Status: DC

## 2023-04-16 MED ORDER — DIPHENHYDRAMINE HCL 50 MG/ML IJ SOLN
12.5000 mg | Freq: Once | INTRAMUSCULAR | Status: AC
  Administered 2023-04-16: 12.5 mg via INTRAVENOUS
  Filled 2023-04-16: qty 1

## 2023-04-16 NOTE — ED Provider Triage Note (Signed)
Emergency Medicine Provider Triage Evaluation Note  Meagan Becker , a 58 y.o. female  was evaluated in triage.  Pt complains of dizziness for 1 weeek.  Review of Systems  Positive:  Negative:   Physical Exam  BP 138/75   Pulse 88   Temp 98.1 F (36.7 C) (Oral)   Resp 18   Ht 5\' 3"  (1.6 m)   Wt 99.8 kg   SpO2 99%   BMI 38.97 kg/m  Gen:   Awake, no distress   Resp:  Normal effort  MSK:   Moves extremities without difficulty  Other:    Medical Decision Making  Medically screening exam initiated at 3:51 PM.  Appropriate orders placed.  DAKSHA KOONE was informed that the remainder of the evaluation will be completed by another provider, this initial triage assessment does not replace that evaluation, and the importance of remaining in the ED until their evaluation is complete.     Faythe Ghee, PA-C 04/16/23 [phone]

## 2023-04-16 NOTE — ED Notes (Signed)
Room darkened for comfort. Blanket given.

## 2023-04-16 NOTE — ED Provider Notes (Signed)
[facility] Provider Note    Event Date/Time   First MD Initiated Contact with Patient 04/16/23 1653     (approximate)   History   Shortness of Breath and Dizziness   HPI  Meagan Becker is a 58 y.o. female with history of vertigo, hypertension, anxiety, presenting with chest pain and shortness of breath.  States that started 2 days ago.  States that she has vertigo for which she is taking meclizine.  States vertigo occurs only when she moves her head to the left.  No other weakness, numbness, slurred speech, unsteady gait.  Thinks that her symptoms may be due to anxiety.  States that she also feels bitemporal pins-and-needles.  States that she has had workups for this before with MRIs and CTs and the sensations are not new.  She denies being on any oral contraceptive pills, no history of malignancy, no unilateral calf swelling or tenderness, and recent travel or surgeries.  No history of blood clots.  No prior history of MI.  Has been taking medications as prescribed.  Was just started on diazepam since she called her physician about her shortness of breath thinking that this might be anxiety related.  States that she has been taking 2.5 mg 3 times a day without significant improvement.  Otherwise no other infectious symptoms.  On his initial chart review she was seen by GI in July of last year, has prior history of hysterectomy as well as cholecystectomy.     Physical Exam   Triage Vital Signs: ED Triage Vitals  Encounter Vitals Group     BP 04/16/23 1151 138/75     Systolic BP Percentile --      Diastolic BP Percentile --      Pulse Rate 04/16/23 1151 88     Resp 04/16/23 1151 18     Temp 04/16/23 1151 98.1 F (36.7 C)     Temp Source 04/16/23 1151 Oral     SpO2 04/16/23 1151 99 %     Weight 04/16/23 1146 220 lb (99.8 kg)     Height 04/16/23 1146 5\' 3"  (1.6 m)     Head Circumference --      Peak Flow --      Pain Score 04/16/23 1145 0     Pain  Loc --      Pain Education --      Exclude from Growth Chart --     Most recent vital signs: Vitals:   04/16/23 1151 04/16/23 1650  BP: 138/75 (!) 143/78  Pulse: 88 83  Resp: 18 14  Temp: 98.1 F (36.7 C) 98.3 F (36.8 C)  SpO2: 99% 100%     General: Awake, no distress.  CV:  Good peripheral perfusion.  Resp:  Normal effort.  Clear bilaterally Abd:  No distention.  Soft nontender Other:  No focal weakness or numbness, pupils equal reactive, extraocular movements are intact, no dysmetria.  No facial droop, no slurred speech.   ED Results / Procedures / Treatments   Labs (all labs ordered are listed, but only abnormal results are displayed) Labs Reviewed  COMPREHENSIVE METABOLIC PANEL - Abnormal; Notable for the following components:      Result Value   Potassium 3.3 (*)    Glucose, Bld 119 (*)    Total Bilirubin 1.3 (*)    All other components within normal limits  CBC WITH DIFFERENTIAL/PLATELET  D-DIMER, QUANTITATIVE  BRAIN NATRIURETIC PEPTIDE  TROPONIN I (HIGH SENSITIVITY)  TROPONIN I (HIGH SENSITIVITY)     EKG  Sinus rhythm, rate 85, normal QRS, normal QTc, T wave flattening to lateral leads as well as aVF, no ischemic ST elevation, not significant change compared to prior   RADIOLOGY CT head on my interpretation without obvious intracranial hemorrhage   PROCEDURES:  Critical Care performed: No  Procedures   MEDICATIONS ORDERED IN ED: Medications  lactated ringers bolus 1,000 mL (1,000 mLs Intravenous New Bag/Given 04/16/23 1740)  prochlorperazine (COMPAZINE) injection 10 mg (10 mg Intravenous Given 04/16/23 1744)  diphenhydrAMINE (BENADRYL) injection 12.5 mg (12.5 mg Intravenous Given 04/16/23 1741)     IMPRESSION / MDM / ASSESSMENT AND PLAN / ED COURSE  I reviewed the triage vital signs and the nursing notes.                              Differential diagnosis includes, but is not limited to, ACS, anxiety, electrolyte derangements, considered  CHF but patient denies any new leg swelling, no prior history of heart failure, she denies infectious symptoms, considered but doubt pneumonia, viral illness.  Also considered PE but patient is low risk, unable to [facility] out due to age.  Will send a D-dimer.  Also get labs, EKG, chest x-ray. for the headache that she states is old, will give her a migraine cocktail of Compazine and Benadryl and IV fluids.  Considered but doubt stroke given that symptoms are reproducible with left-sided head movement.  Is intermittent and she does not have any focal neurodeficits on exam.  Patient's presentation is most consistent with acute presentation with potential threat to life or bodily function.  Independent review of labs and imaging noted elsewhere in the chart.  On reassessment patient is feeling lot better.  Will let her complete the rest of her IV fluids.  Husband will be coming to pick her up.  Shared decision making done with patient and she is agreeable plan for discharge.  Has a cardiology follow-up appointment in March, discussed calling the office to see if they can see her sooner.  Otherwise considered but no indication for inpatient admission at this time, she is safe for outpatient management.  Strict return precautions given.  Clinical Course as of 04/16/23 1756  Fri Apr 16, 2023  1735 CT HEAD WO CONTRAST ( ) IMPRESSION: Negative head CT.   [TT]  1735 DG Chest 2 View No active cardiopulmonary disease.  [TT]  1750 Independent review of labs, BNP is not elevated, electrolytes not severely deranged, no leukocytosis, Trope is negative, BNP is negative, dimer is not elevated.  No need for second Trope since symptoms started 2 days ago. [TT]    Clinical Course User Index [TT] Jodie Echevaria Franchot Erichsen, MD     FINAL CLINICAL IMPRESSION(S) / ED DIAGNOSES   Final diagnoses:  Dizziness  Shortness of breath  Chest pain, unspecified type  Nonintractable episodic headache, unspecified headache type      Rx / DC Orders   ED Discharge Orders     None        Note:  This document was prepared using Dragon voice recognition software and may include unintentional dictation errors.    Claybon Jabs, MD 04/16/23 [phone]

## 2023-04-16 NOTE — Discharge Instructions (Signed)
Please call your cardiologist office today to schedule an earlier follow-up and discuss if you need to get a stress test done.  Please return if you have recurrent or worsening symptoms, severe chest pain, shortness of breath, if you are able to only walk a couple steps and feel like you cannot catch her breath, if you feel lightheaded, you pass out, if you have constant dizziness, weakness, numbness, vision changes, or if you have any additional concerns.

## 2023-04-16 NOTE — ED Triage Notes (Signed)
Pt presents to the ED POV from home. Pt reports that she was recently diagnosed with vertigo. Pt states that she is now having new sx's that started about two days ago. Reports the sx's including SHOB and weird sensations in her head and chest. Pt describes sensations in her head to be like pins and needles on either side of temples and a bubbly sensation in her chest. Pt reports that she stopped taking the meclizine when the dizziness went away. Reports calling her PCP and they prescribed her diazepam. States that she has tried taking that, but it hasn't helped. Pt A&Ox4 at time of triage. Ambulatory to triage room.

## 2023-07-14 ENCOUNTER — Other Ambulatory Visit: Payer: Self-pay | Admitting: Pulmonary Disease

## 2023-07-14 DIAGNOSIS — R7989 Other specified abnormal findings of blood chemistry: Secondary | ICD-10-CM

## 2023-07-14 DIAGNOSIS — R0600 Dyspnea, unspecified: Secondary | ICD-10-CM

## 2023-07-15 ENCOUNTER — Ambulatory Visit
Admission: RE | Admit: 2023-07-15 | Discharge: 2023-07-15 | Disposition: A | Discharge status: Home / Self Care | Source: Ambulatory Visit | Attending: Pulmonary Disease | Admitting: Pulmonary Disease

## 2023-07-15 DIAGNOSIS — R7989 Other specified abnormal findings of blood chemistry: Secondary | ICD-10-CM

## 2023-07-15 DIAGNOSIS — R0600 Dyspnea, unspecified: Secondary | ICD-10-CM

## 2023-07-15 MED ORDER — IOPAMIDOL (ISOVUE-370) INJECTION 76%
75.0000 mL | Freq: Once | INTRAVENOUS | Status: AC | PRN
  Administered 2023-07-15: 75 mL via INTRAVENOUS

## 2023-12-11 ENCOUNTER — Emergency Department: Payer: Self-pay

## 2023-12-11 ENCOUNTER — Emergency Department
Admission: EM | Admit: 2023-12-11 | Discharge: 2023-12-11 | Disposition: A | Discharge status: Home / Self Care | Payer: Self-pay | Attending: Emergency Medicine | Admitting: Emergency Medicine

## 2023-12-11 DIAGNOSIS — E876 Hypokalemia: Secondary | ICD-10-CM | POA: Insufficient documentation

## 2023-12-11 DIAGNOSIS — R0600 Dyspnea, unspecified: Secondary | ICD-10-CM | POA: Insufficient documentation

## 2023-12-11 DIAGNOSIS — R1011 Right upper quadrant pain: Secondary | ICD-10-CM | POA: Insufficient documentation

## 2023-12-11 DIAGNOSIS — I10 Essential (primary) hypertension: Secondary | ICD-10-CM | POA: Insufficient documentation

## 2023-12-11 LAB — CBC
HCT: 35.5 % — ABNORMAL LOW (ref 36.0–46.0)
Hemoglobin: 12.4 g/dL (ref 12.0–15.0)
MCH: 29.7 pg (ref 26.0–34.0)
MCHC: 34.9 g/dL (ref 30.0–36.0)
MCV: 85.1 fL (ref 80.0–100.0)
Platelets: 345 K/uL (ref 150–400)
RBC: 4.17 MIL/uL (ref 3.87–5.11)
RDW: 12.5 % (ref 11.5–15.5)
WBC: 6.6 K/uL (ref 4.0–10.5)
nRBC: 0 % (ref 0.0–0.2)

## 2023-12-11 LAB — URINALYSIS, ROUTINE W REFLEX MICROSCOPIC
Bilirubin Urine: NEGATIVE
Glucose, UA: NEGATIVE mg/dL
Hgb urine dipstick: NEGATIVE
Ketones, ur: NEGATIVE mg/dL
Leukocytes,Ua: NEGATIVE
Nitrite: NEGATIVE
Protein, ur: NEGATIVE mg/dL
Specific Gravity, Urine: 1.019 (ref 1.005–1.030)
pH: 5 (ref 5.0–8.0)

## 2023-12-11 LAB — COMPREHENSIVE METABOLIC PANEL WITH GFR
ALT: 21 U/L (ref 0–44)
AST: 28 U/L (ref 15–41)
Albumin: 4.1 g/dL (ref 3.5–5.0)
Alkaline Phosphatase: 42 U/L (ref 38–126)
Anion gap: 10 (ref 5–15)
BUN: 23 mg/dL — ABNORMAL HIGH (ref 6–20)
CO2: 29 mmol/L (ref 22–32)
Calcium: 9.7 mg/dL (ref 8.9–10.3)
Chloride: 101 mmol/L (ref 98–111)
Creatinine, Ser: 0.92 mg/dL (ref 0.44–1.00)
GFR, Estimated: >60 mL/min (ref >60)
Glucose, Bld: 118 mg/dL — ABNORMAL HIGH (ref 70–99)
Potassium: 3.1 mmol/L — ABNORMAL LOW (ref 3.5–5.1)
Sodium: 140 mmol/L (ref 135–145)
Total Bilirubin: 1.2 mg/dL (ref 0.0–1.2)
Total Protein: 7.7 g/dL (ref 6.5–8.1)

## 2023-12-11 LAB — LIPASE, BLOOD: Lipase: 53 U/L — ABNORMAL HIGH (ref 11–51)

## 2023-12-11 LAB — TROPONIN I (HIGH SENSITIVITY)
Troponin I (High Sensitivity): 3 ng/L (ref <18)
Troponin I (High Sensitivity): 3 ng/L (ref <18)

## 2023-12-11 LAB — RESP PANEL BY RT-PCR (RSV, FLU A&B, COVID)  RVPGX2
Influenza A by PCR: NEGATIVE
Influenza B by PCR: NEGATIVE
Resp Syncytial Virus by PCR: NEGATIVE
SARS Coronavirus 2 by RT PCR: NEGATIVE

## 2023-12-11 MED ORDER — DIAZEPAM 5 MG PO TABS
5.0000 mg | ORAL_TABLET | Freq: Once | ORAL | Status: AC
  Administered 2023-12-11: 5 mg via ORAL
  Filled 2023-12-11: qty 1

## 2023-12-11 NOTE — ED Provider Notes (Signed)
 [facility] Provider Note    Event Date/Time   First MD Initiated Contact with Patient 12/11/23 1109     (approximate)  History   Chief Complaint: Shortness of Breath and Abdominal Pain  HPI  MODEAN MCCULLUM is a 58 y.o. female with a past medical history of anxiety, hypertension, hyperlipidemia, presents to the emergency department for shortness of breath.  According to the patient over the last few days she has been experiencing worsening shortness of breath.  Patient states she has a history of anxiety which often times presents as shortness of breath.  Patient states she has followed up with pulmonology she has followed up with cardiology and they have not found any reason for her symptoms besides anxiety.  Patient is prescribed diazepam to be taken as needed for anxiety but has not taken today.  Patient states she has been experiencing some mild discomfort in the right upper quadrant of her abdomen at times as well described as a fluttering only if she sits forward.  Patient states she is status post cholecystectomy many years ago.  No fever.  No nausea or vomiting.  No cough or congestion.  Physical Exam   Triage Vital Signs: ED Triage Vitals  Encounter Vitals Group     BP 12/11/23 1057 (!) 143/100     Girls Systolic BP Percentile --      Girls Diastolic BP Percentile --      Boys Systolic BP Percentile --      Boys Diastolic BP Percentile --      Pulse Rate 12/11/23 1057 97     Resp 12/11/23 1057 18     Temp 12/11/23 1057 98.3 F (36.8 C)     Temp Source 12/11/23 1057 Oral     SpO2 12/11/23 1057 98 %     Weight 12/11/23 1058 214 lb (97.1 kg)     Height 12/11/23 1058 5' 3 (1.6 m)     Head Circumference --      Peak Flow --      Pain Score 12/11/23 1057 8     Pain Loc --      Pain Education --      Exclude from Growth Chart --     Most recent vital signs: Vitals:   12/11/23 1057  BP: (!) 143/100  Pulse: 97  Resp: 18  Temp: 98.3 F (36.8  C)  SpO2: 98%    General: Awake, no distress.  CV:  Good peripheral perfusion.  Regular rate and rhythm  Resp:  Normal effort.  Equal breath sounds bilaterally.  Lung sounds are clear without wheeze rales or rhonchi. Abd:  No distention.  Soft, nontender.  No rebound or guarding.  ED Results / Procedures / Treatments   EKG  I have reviewed and interpreted the EKG which appears show a normal sinus rhythm at 91 bpm with a narrow QRS, normal axis, normal intervals, no concerning ST changes.  RADIOLOGY  I have reviewed interpret the chest x-ray images.  No consolidation on my evaluation.   MEDICATIONS ORDERED IN ED: Medications  diazepam (VALIUM) tablet 5 mg (5 mg Oral Given 12/11/23 1131)    IMPRESSION / MDM / ASSESSMENT AND PLAN / ED COURSE  I reviewed the triage vital signs and the nursing notes.  Patient's presentation is most consistent with acute presentation with potential threat to life or bodily function.  Patient presents to the emergency department for shortness of breath.  Patient with a history  of anxiety but states her shortness of breath feels somewhat different today than her typical anxiety symptoms.  We will check labs including a CBC chemistry and troponin.  Given the patient's right upper quadrant symptoms we have also added on LFTs and a lipase although patient is status postcholecystectomy.  Will obtain a chest x-ray COVID swab and continue to closely monitor.  Patient agreeable to plan of care.  Will dose the patient's 5 mg oral diazepam which she is prescribed for chronic anxiety.  Patient's lab work so far is reassuring with a normal CBC with a normal white blood cell count, reassuring chemistry with normal LFTs.  Normal urinalysis and reassuringly negative troponin.  We will continue to close monitor while waiting for the results.  Patient's workup is reassuring with a normal CBC chemistry troponin.  Normal respiratory panel.  Troponin negative x 2.  Chest  x-ray is clear.  Will discharge patient have her follow-up with her doctor.  Patient reassured by the workup.  FINAL CLINICAL IMPRESSION(S) / ED DIAGNOSES   Dyspnea    Note:  This document was prepared using Dragon voice recognition software and may include unintentional dictation errors.   Dorothyann Drivers, MD 12/11/23 1438

## 2023-12-11 NOTE — ED Triage Notes (Signed)
 Pt c/o intermittent SOB, intermittent epigastric pain, and swelling in RUQ/lower R ribs x 4 days.  Pain score 8/10.  Pt is followed by Pulmonology.   NAD noted.  Pt easily speaks full sentences.

## 2024-01-22 ENCOUNTER — Ambulatory Visit: Payer: Self-pay

## 2024-01-22 ENCOUNTER — Ambulatory Visit
Admission: EM | Admit: 2024-01-22 | Discharge: 2024-01-22 | Disposition: A | Discharge status: Home / Self Care | Payer: Self-pay | Attending: Physician Assistant | Admitting: Physician Assistant

## 2024-01-22 ENCOUNTER — Encounter: Payer: Self-pay | Admitting: Emergency Medicine

## 2024-01-22 DIAGNOSIS — M25512 Pain in left shoulder: Secondary | ICD-10-CM

## 2024-01-22 DIAGNOSIS — R079 Chest pain, unspecified: Secondary | ICD-10-CM

## 2024-01-22 DIAGNOSIS — R0789 Other chest pain: Secondary | ICD-10-CM

## 2024-01-22 DIAGNOSIS — M5412 Radiculopathy, cervical region: Secondary | ICD-10-CM

## 2024-01-22 MED ORDER — TRAMADOL HCL 50 MG PO TABS: 50.0000 mg | ORAL_TABLET | Freq: Four times a day (QID) | ORAL | 0 refills | Status: AC | PRN

## 2024-01-22 MED ORDER — PREDNISONE 10 MG PO TABS
ORAL_TABLET | ORAL | 0 refills | Status: AC
Start: 2024-01-22 — End: ?

## 2024-01-22 MED ORDER — TIZANIDINE HCL 4 MG PO TABS: 4.0000 mg | ORAL_TABLET | Freq: Three times a day (TID) | ORAL | 0 refills | Status: AC | PRN

## 2024-01-22 NOTE — Discharge Instructions (Addendum)
-  EKG is normal -I did not see any acute abnormalities on xray but I will call you if the radiologist sees something abnormal when they read. I'll call tomorrow if they do.  -Suspected pinched nerve in neck -Take prednisone , tylenol , muscle relaxer. Also sent tramadol  pain meds if needed -F/u with PCP  NECK AND SHOULDER PAIN: Stressed avoiding painful activities. This can exacerbate your symptoms and make them worse.  May apply heat to the areas of pain for some relief. Use medications as directed. Be aware of which medications make you drowsy and do not drive or operate any kind of heavy machinery while using the medication (ie pain medications or muscle relaxers). F/U with PCP for reexamination or return sooner if condition worsens or does not begin to improve over the next few days.   NECK PAIN RED FLAGS: If symptoms get worse than they are right now, you should come back sooner for re-evaluation. If you have increased numbness/ tingling or notice that the numbness/tingling is affecting the legs or saddle region, go to ER. If you ever lose continence go to ER.      If chest pain worsens or you have palpitations, shortness of breath, dizziness, feel faint, fall or pass out please immediately get to ER. Call 911.

## 2024-01-22 NOTE — ED Triage Notes (Signed)
 Patient reports left shoulder pain that started a week ago.  Patient states that the pain is worse today.  Patient denies injury or fall. Patient reports constant pain and worsening pain when she moves her left arm.

## 2024-01-22 NOTE — ED Provider Notes (Signed)
 [facility]    CSN: [id number] Arrival date & time: 01/22/24  1502      History   Chief Complaint Chief Complaint  Patient presents with   Shoulder Pain    left    HPI Meagan Becker is a 58 y.o. female with history of anxiety, hypertension, hyperlipidemia, obesity, pre-diabetes, and syringomyelia.  Patient presents today for ~1 week history of left sided neck and left shoulder pain.  Reports most significant pain of the left inner upper arm.  She reports it feels like the arm is waking up, going to sleep, waking up, going to sleep.  No falls, trauma or injury. Patient says pain has worsened today and is constant now. Increased pain with fully raising the left shoulder.  States when she turns her neck to the right the discomfort in her arm eases off a little.  Pain does radiate all the way down the left arm at times.  Patient reports some discomfort of the left upper chest but states is not as bad as the arm pain.  No associated shortness of breath, palpitations, dizziness, weakness, numbness/tingling.  No swelling, rashes, contusions or wounds.   No history of shoulder problems.   Has taken meloxicam , Tylenol , used a compression bandage as well applied ice and heat without relief.  HPI  Past Medical History:  Diagnosis Date   Anxiety disorder    Anxiety state    Cellulitis    Headache    History of 2019 novel coronavirus disease (COVID-19) 03/12/2019   Hyperlipidemia    Hypertension    Menopausal symptom    Obesity    Pain, joint, ankle    Panic disorder    Retinal vascular changes    Sprain of neck    Stricture of ureter    Upper respiratory infection    Wheezing     Patient Active Problem List   Diagnosis Date Noted   Essential hypertension 02/20/2013   Chest discomfort 02/20/2013    Past Surgical History:  Procedure Laterality Date   ABDOMINAL HYSTERECTOMY     ANKLE SURGERY     BREAST ENHANCEMENT SURGERY     CHOLECYSTECTOMY      ESOPHAGOGASTRODUODENOSCOPY (EGD) WITH PROPOFOL  N/A 12/11/2022   Procedure: ESOPHAGOGASTRODUODENOSCOPY (EGD) WITH PROPOFOL ;  Surgeon: Maryruth Ole DASEN, MD;  Location: [facility] END[facility];  Service: Endoscopy;  Laterality: N/A;   REDUCTION MAMMAPLASTY Bilateral    Pt had breast reduction surgery 30 years or so ago   TONSILLECTOMY      OB History   No obstetric history on file.      Home Medications    Prior to Admission medications   Medication Sig Start Date End Date Taking? Authorizing Provider  buPROPion (WELLBUTRIN XL) 150 MG 24 hr tablet Take by mouth. 05/12/22  Yes [provider]  hydrochlorothiazide  (HYDRODIURIL ) 25 MG tablet Take 25 mg by mouth 2 (two) times daily. 02/28/21  Yes [provider]  metoprolol succinate (TOPROL-XL) 25 MG 24 hr tablet Take 25 mg by mouth daily. 10/02/20  Yes [provider]  pantoprazole (PROTONIX) 20 MG tablet Take by mouth.   Yes [provider]  predniSONE  (DELTASONE ) 10 MG tablet Take 6 tabs p.o. on day 1 and decrease by 1 tablet daily until complete 01/22/24  Yes Arvis Huxley B, PA-C  tiZANidine (ZANAFLEX) 4 MG tablet Take 1 tablet (4 mg total) by mouth every 8 (eight) hours as needed for up to 7 days for muscle spasms. 01/22/24 01/29/24 Yes  Arvis Jolan NOVAK, PA-C  traMADol  (ULTRAM ) 50 MG tablet Take 1 tablet (50 mg total) by mouth every 6 (six) hours as needed for severe pain (pain score 7-10). 01/22/24  Yes Arvis Jolan B, PA-C  valsartan (DIOVAN) 160 MG tablet Take 160 mg by mouth daily. 01/02/21  Yes [provider]  diazepam (VALIUM) 10 MG tablet Take 0.5 tablets by mouth daily as needed. 03/06/16   [provider]  Spacer/Aero-Holding Chambers (AEROCHAMBER MV) inhaler Use as instructed 03/10/21   Bernardino Ditch, NP  sucralfate  (CARAFATE ) 1 GM/10ML suspension Take 10 mLs (1 g total) by mouth 4 (four) times daily. 06/25/22 06/25/23  Fisher, Devere ORN, PA-C    Family History Family History  Problem  Relation Age of Onset   Hypercholesterolemia Mother    Hypertension Mother    Heart disease Father    Hypertension Father    Cancer Father    Cancer Other     Social History Social History   Tobacco Use   Smoking status: Never    Passive exposure: Never   Smokeless tobacco: Never  Vaping Use   Vaping status: Never Used  Substance Use Topics   Alcohol use: Not Currently   Drug use: Not Currently    Types: Marijuana    Comment: past - last use 3-4 years ago     Allergies   Hydrocodone -acetaminophen    Review of Systems Review of Systems  Constitutional:  Negative for fatigue.  Respiratory:  Negative for shortness of breath.   Cardiovascular:  Positive for chest pain. Negative for palpitations and leg swelling.  Musculoskeletal:  Positive for arthralgias, neck pain and neck stiffness. Negative for back pain and gait problem.  Skin:  Negative for color change, rash and wound.  Neurological:  Negative for dizziness, syncope, weakness, numbness and headaches.     Physical Exam Triage Vital Signs ED Triage Vitals  Encounter Vitals Group     BP      Girls Systolic BP Percentile      Girls Diastolic BP Percentile      Boys Systolic BP Percentile      Boys Diastolic BP Percentile      Pulse      Resp      Temp      Temp src      SpO2      Weight      Height      Head Circumference      Peak Flow      Pain Score      Pain Loc      Pain Education      Exclude from Growth Chart    No data found.  Updated Vital Signs BP 139/80 (BP Location: Right Arm)   Pulse 76   Temp 98.6 F (37 C) (Oral)   Resp 14   Ht 5' 3 (1.6 m)   Wt 214 lb 1.1 oz (97.1 kg)   SpO2 98%   BMI 37.92 kg/m   Physical Exam Vitals and nursing note reviewed.  Constitutional:      General: She is not in acute distress.    Appearance: Normal appearance. She is not ill-appearing or toxic-appearing.  HENT:     Head: Normocephalic and atraumatic.     Nose: Nose normal.      Mouth/Throat:     Mouth: Mucous membranes are moist.     Pharynx: Oropharynx is clear.  Eyes:     General: No scleral icterus.  Right eye: No discharge.        Left eye: No discharge.     Conjunctiva/sclera: Conjunctivae normal.  Cardiovascular:     Rate and Rhythm: Normal rate and regular rhythm.     Heart sounds: Normal heart sounds.  Pulmonary:     Effort: Pulmonary effort is normal. No respiratory distress.     Breath sounds: Normal breath sounds.  Musculoskeletal:     Left shoulder: Tenderness (generalized tenderness of shoulder and upper arm) present. No swelling or deformity. Normal range of motion.     Cervical back: Normal range of motion and neck supple. Tenderness (left paracervical and trap) present. No pain with movement. Normal range of motion.     Comments: Full strength of extremities   Skin:    General: Skin is dry.  Neurological:     General: No focal deficit present.     Mental Status: She is alert. Mental status is at baseline.     Motor: No weakness.     Gait: Gait normal.  Psychiatric:        Mood and Affect: Mood normal.        Behavior: Behavior normal.      UC Treatments / Results  Labs (all labs ordered are listed, but only abnormal results are displayed) Labs Reviewed - No data to display  EKG   Radiology No results found.  Procedures ED EKG  Date/Time: 01/22/2024 3:44 PM  Performed by: Arvis Jolan NOVAK, PA-C Authorized by: Arvis Jolan NOVAK, PA-C   Interpretation:    Interpretation: normal   Rate:    ECG rate:  75   ECG rate assessment: normal   Rhythm:    Rhythm: sinus rhythm   Ectopy:    Ectopy: none   QRS:    QRS axis:  Normal   QRS intervals:  Normal   QRS conduction: normal   ST segments:    ST segments:  Normal Comments:     Normal sinus rhythm. Regular rate.  (including critical care time)  Medications Ordered in UC Medications - No data to display  Initial Impression / Assessment and Plan / UC Course  I have  reviewed the triage vital signs and the nursing notes.  Pertinent labs & imaging results that were available during my care of the patient were reviewed by me and considered in my medical decision making (see chart for details).   58 y/o female with history of anxiety, hypertension, hyperlipidemia, obesity, pre-diabetes, and syringomyelia presents today for ~1 week history of left sided neck and left shoulder pain.  Has also had mild left upper chest pain over the past week.  Taking meloxicam , Tylenol  and using heat, ice and compression wrap without relief.  Vitals are stable and normal. Patient overall well appearing. NAD.  Chest clear.  Heart regular rate and rhythm.  Has tenderness of the left side of neck and trap, generalized tenderness of left shoulder and upper arm.  Full range of motion of neck and shoulder but discomfort with full flexion and abduction of left shoulder.  EKG performed today. Normal.  Left shoulder x-ray performed.   *** Final Clinical Impressions(s) / UC Diagnoses   Final diagnoses:  Acute pain of left shoulder  Cervical radiculopathy  Chest wall pain     Discharge Instructions      -EKG is normal -I did not see any acute abnormalities on xray but I will call you if the radiologist sees something abnormal when they read. I'll  call tomorrow if they do.  -Suspected pinched nerve in neck -Take prednisone , tylenol , muscle relaxer. Also sent tramadol  pain meds if needed -F/u with PCP  NECK AND SHOULDER PAIN: Stressed avoiding painful activities. This can exacerbate your symptoms and make them worse.  May apply heat to the areas of pain for some relief. Use medications as directed. Be aware of which medications make you drowsy and do not drive or operate any kind of heavy machinery while using the medication (ie pain medications or muscle relaxers). F/U with PCP for reexamination or return sooner if condition worsens or does not begin to improve over the next few  days.   NECK PAIN RED FLAGS: If symptoms get worse than they are right now, you should come back sooner for re-evaluation. If you have increased numbness/ tingling or notice that the numbness/tingling is affecting the legs or saddle region, go to ER. If you ever lose continence go to ER.      If chest pain worsens or you have palpitations, shortness of breath, dizziness, feel faint, fall or pass out please immediately get to ER. Call 911.     ED Prescriptions     Medication Sig Dispense Auth. Provider   predniSONE  (DELTASONE ) 10 MG tablet Take 6 tabs p.o. on day 1 and decrease by 1 tablet daily until complete 21 tablet Arvis Huxley B, PA-C   tiZANidine (ZANAFLEX) 4 MG tablet Take 1 tablet (4 mg total) by mouth every 8 (eight) hours as needed for up to 7 days for muscle spasms. 20 tablet Arvis Huxley B, PA-C   traMADol  (ULTRAM ) 50 MG tablet Take 1 tablet (50 mg total) by mouth every 6 (six) hours as needed for severe pain (pain score 7-10). 15 tablet Arvis Huxley NOVAK, PA-C      I have reviewed the PDMP during this encounter.

## 2024-01-23 ENCOUNTER — Ambulatory Visit: Payer: Self-pay | Admitting: Physician Assistant
